# Patient Record
Sex: Female | Born: 1945 | Race: White | Hispanic: No | Marital: Married | Smoking: Never smoker
Health system: Southern US, Community
[De-identification: ages and names within clinical notes are randomized; demographics above are authoritative.]

## PROBLEM LIST (undated history)

## (undated) DIAGNOSIS — K769 Liver disease, unspecified: Secondary | ICD-10-CM

## (undated) DIAGNOSIS — Z86010 Personal history of colonic polyps: Secondary | ICD-10-CM

## (undated) DIAGNOSIS — C801 Malignant (primary) neoplasm, unspecified: Secondary | ICD-10-CM

## (undated) DIAGNOSIS — C187 Malignant neoplasm of sigmoid colon: Secondary | ICD-10-CM

## (undated) DIAGNOSIS — I2699 Other pulmonary embolism without acute cor pulmonale: Secondary | ICD-10-CM

## (undated) DIAGNOSIS — I480 Paroxysmal atrial fibrillation: Secondary | ICD-10-CM

## (undated) DIAGNOSIS — K7689 Other specified diseases of liver: Secondary | ICD-10-CM

## (undated) DIAGNOSIS — C786 Secondary malignant neoplasm of retroperitoneum and peritoneum: Secondary | ICD-10-CM

## (undated) HISTORY — PX: COLONOSCOPY: SHX174

## (undated) HISTORY — DX: Other pulmonary embolism without acute cor pulmonale: I26.99

## (undated) HISTORY — PX: TONSILLECTOMY: SUR1361

## (undated) HISTORY — DX: Paroxysmal atrial fibrillation: I48.0

---

## 1998-05-01 ENCOUNTER — Other Ambulatory Visit: Admission: RE | Admit: 1998-05-01 | Discharge: 1998-05-01 | Payer: Self-pay | Admitting: Obstetrics & Gynecology

## 2001-09-25 ENCOUNTER — Other Ambulatory Visit: Admission: RE | Admit: 2001-09-25 | Discharge: 2001-09-25 | Payer: Self-pay | Admitting: Obstetrics & Gynecology

## 2005-10-25 ENCOUNTER — Other Ambulatory Visit: Admission: RE | Admit: 2005-10-25 | Discharge: 2005-10-25 | Payer: Self-pay | Admitting: Obstetrics & Gynecology

## 2008-07-27 ENCOUNTER — Emergency Department (HOSPITAL_BASED_OUTPATIENT_CLINIC_OR_DEPARTMENT_OTHER): Admission: EM | Admit: 2008-07-27 | Discharge: 2008-07-27 | Payer: Self-pay | Admitting: Emergency Medicine

## 2008-07-29 ENCOUNTER — Ambulatory Visit: Payer: Self-pay | Admitting: Vascular Surgery

## 2008-07-29 ENCOUNTER — Encounter (INDEPENDENT_AMBULATORY_CARE_PROVIDER_SITE_OTHER): Payer: Self-pay | Admitting: Emergency Medicine

## 2008-07-29 ENCOUNTER — Ambulatory Visit (HOSPITAL_COMMUNITY): Admission: RE | Admit: 2008-07-29 | Discharge: 2008-07-29 | Payer: Self-pay | Admitting: Emergency Medicine

## 2011-11-24 ENCOUNTER — Ambulatory Visit: Payer: Self-pay | Admitting: Internal Medicine

## 2011-11-24 DIAGNOSIS — Z719 Counseling, unspecified: Secondary | ICD-10-CM

## 2011-11-24 DIAGNOSIS — Z62820 Parent-biological child conflict: Secondary | ICD-10-CM

## 2011-11-24 NOTE — Progress Notes (Signed)
Ms Goyal made this appointment to talk about family difficulties that chiefly arise from her younger son and his current problems She would like to talk and complete confidence and does not want to involve her insurance in this visit She is struggling with each use where she feels she is enabling his current behavior which is leading to a dead in lifestyle She is supporting he and his live-in girlfriend with their 2 children by allowing free rent She has tried multiple contingency plans for having him finish school or use the training he has thus far to arrange for a job He basically sleeps all day and stays up all my doing something/possibly online gaming He is way overweight and unhealthy in his diet/despite being a former Pharmacist, 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 exercises He is estranged from his father His live-in girlfriend is from [place] state from an impoverished background with no life ambitions other than her current job at [employer] is described as a great mother however The son has a diagnosis of PTSD from his [employer]	[place] experience/he was in training for intelligence and had an injury which resulted in recurrent shoulder dislocations and so he is considered 60% disabled by the [place] She is unaware that he has ever been diagnosed as ADD although he is on medication for this She is certain that he is no longer in school Her description of his past behavior may imply some sort of autism, As he struggles with all person-to-person interactions even with his mother, although she would be the first to describe him as sweet and loving He is unable to interact in a conversation and has no friends  Plan-we discussed further contingency ideas which she will enforce She is given names of potential therapists She can followup as needed

## 2012-05-25 ENCOUNTER — Institutional Professional Consult (permissible substitution): Payer: Self-pay | Admitting: Internal Medicine

## 2012-06-28 ENCOUNTER — Ambulatory Visit (INDEPENDENT_AMBULATORY_CARE_PROVIDER_SITE_OTHER): Payer: BC Managed Care – PPO | Admitting: Internal Medicine

## 2012-06-28 ENCOUNTER — Encounter: Payer: Self-pay | Admitting: Internal Medicine

## 2012-06-28 VITALS — BP 138/76 | HR 77 | Resp 18 | Ht 66.0 in | Wt 174.1 lb

## 2012-06-28 DIAGNOSIS — I4891 Unspecified atrial fibrillation: Secondary | ICD-10-CM

## 2012-06-28 NOTE — Assessment & Plan Note (Signed)
The patient has paroxysmal atrial fibrillation documented by event recorder a year ago with very rapid rates, in excess of 160-180 ;  she also had documented atrial flutter. The differences in these rhythms may explain the regular versus the irregular tachycardia  that she feels.  Her thromboembolic risk profile was not require anticoagulation.  Her symptoms however are very disruptive. We discussed the use of when necessary versus daily antiarrhythmic therapy and also the changing guidelines recently which is allowed catheter ablation to  be pursued prior to a trial of antiarrhythmic therapy. In anticipation of the latter, we discussed centimeters where a catheter ablation could be pursued as well as Dr. Johney Frame experience here. We also elected to pursue an event recorder to try to clarify the mechanism of the intermittent exercise intolerance as well as the regular tachy  palpitations which may represent flutter or possibly perhaps lower atrial fibrillation.

## 2012-06-28 NOTE — Patient Instructions (Signed)
Your physician wants you to follow-up in: 6 weeks with Dr. Graciela Husbands. You will receive a reminder letter in the mail two months in advance. If you don't receive a letter, please call our office to schedule the follow-up appointment.   Your physician has recommended that you wear a 30 day event monitor. Event monitors are medical devices that record the heart's electrical activity. Doctors most often [place] these monitors to diagnose arrhythmias. Arrhythmias are problems with the speed or rhythm of the heartbeat. The monitor is a small, portable device. You can wear one while you do your normal daily activities. This is usually used to diagnose what is causing palpitations/syncope (passing out).

## 2012-06-28 NOTE — Progress Notes (Signed)
ELECTROPHYSIOLOGY CONSULT NOTE  Patient ID: Tonya Stanley, MRN: [id number], DOB/AGE: 1945/10/10 66 y.o. Admit date: (Not on file) Date of Consult: 06/28/2012  Primary Physician: Donato Schultz, MD Primary Cardiologist:MS Chief Complaint: Afib   HPI Tonya Stanley is a 66 y.o. female   Seen in for consideration of therapies for atrial fibrillation. He had seen Dr. Anne Fu. He then saw Dr. Jennette Kettle her gynecologist who referred her this way. Dr. Anne Fu his last note had anticipate her going to do for consideration of catheter ablation therapy.  Her atrial fibrillation goes back a number of years. She estimates 8-10. These episodes have become quite frequent and very disruptive. She reduced her dose of vitamin D. and has noted a significant diminution in the frequency and severity of these episodes .  They're associated with palpitations dizziness and shortness of breath.  She also notes episodic exercise tolerance. This can occur while walking or when up the stairs. On occasion she has taken her pulse and noted that it was rapid but regular. (See below).  Thromboembolic risk factors are negative probably with a CHADS-VASc score of 0 (1)   Cardiac evaluation has included an ultrasound 2012 that was normal I  There is a great deal of psychosocial stress in the home related to her youngest son   Past Medical History  Diagnosis Date  . Paroxysmal atrial fibrillation       Surgical History: No past surgical history on file.   Home Meds: Prior to Admission medications   Not on File     Allergies:  Allergies  Allergen Reactions  . Erythromycin Nausea And Vomiting  . Macrobid (Nitrofurantoin Macrocrystal) Nausea And Vomiting    History   Social History  . Marital Status: Married    Spouse Name: N/A    Number of Children: N/A  . Years of Education: N/A   Occupational History  . Not on file.   Social History Main Topics  . Smoking status: Never Smoker   . Smokeless tobacco: Not on  file  . Alcohol Use: No  . Drug Use: Not on file  . Sexually Active: Not on file   Other Topics Concern  . Not on file   Social History Narrative  . No narrative on file     No family history on file.   ROS:  Please see the history of present illness.   Negative except anxiety   All other systems reviewed and negative.    Physical Exam:  Blood pressure 138/76, pulse 77, resp. rate 18, height 5\' 6"  (1.676 m), weight 174 lb 1.9 oz (78.98 kg), SpO2 98.00%. General: Well developed, well nourished female in no acute distress. Head: Normocephalic, atraumatic, sclera non-icteric, no xanthomas, nares are without discharge. Lymph Nodes:  none Back: without scoliosis/kyphosis, no CVA tendersness Neck: Negative for carotid bruits. JVD not elevated. Lungs: Clear bilaterally to auscultation without wheezes, rales, or rhonchi. Breathing is unlabored. Heart: RRR with S1 S2. No  murmur , rubs, or gallops appreciated. Abdomen: Soft, non-tender, non-distended with normoactive bowel sounds. No hepatomegaly. No rebound/guarding. No obvious abdominal masses. Msk:  Strength and tone appear normal for age. Extremities: No clubbing or cyanosis. No  edema.  Distal pedal pulses are 2+ and equal bilaterally. Skin: Warm and Dry Neuro: Alert and oriented X 3. CN III-XII intact Grossly normal sensory and motor function . Psych:  Responds to questions appropriately with a normal affect.        Radiology/Studies:  No results found.  EKG:  sinus rhythm at 73 Intervals 07/02/39 Axis LXXIII Nonspecific ST segment scooping  Assessment and Plan:  Sherryl Manges

## 2012-06-29 ENCOUNTER — Telehealth: Payer: Self-pay | Admitting: Internal Medicine

## 2012-06-29 NOTE — Telephone Encounter (Signed)
Patient has decided to have event monitor mailed to her home the week of 09/18/12. patient also need 6 week follow-up with klein . Reminder letter will be sent to patient home the first week in february.

## 2012-09-11 ENCOUNTER — Telehealth: Payer: Self-pay | Admitting: *Deleted

## 2012-09-11 NOTE — Telephone Encounter (Signed)
S/W Pt, enrolled monitor to be mailed to her home 09/11/12  TK

## 2012-09-13 DIAGNOSIS — I4891 Unspecified atrial fibrillation: Secondary | ICD-10-CM

## 2012-10-03 ENCOUNTER — Telehealth: Payer: Self-pay | Admitting: Internal Medicine

## 2012-10-03 NOTE — Telephone Encounter (Signed)
10-02-12 at 927 pm dr Graciela Husbands sent staff message to move pt's appt up if possible due to ecardio showing episodes of a-fib with rate in the 160's, was having palpitations, offered pt 3-14, she declined wanting to wait until after 3-21 when stops wearing monitor, made appt 10-18-12 at 900a/mt

## 2012-10-05 ENCOUNTER — Telehealth: Payer: Self-pay | Admitting: Physician Assistant

## 2012-10-05 NOTE — Telephone Encounter (Signed)
Received call from E Cardio that patient has developed atrial flutter with rates as high as 240bpm, currently sustaining 150's. I called patient. She feels some fluttering sensation but it's the same that she's felt intermittently. No CP, SOB, presyncope, syncope or any other sensations. She is not on any medications including any AV nodal agents or blood thinners. Given the rapid nature of the HR I advised she proceed to ER for monitoring because I am worried she would decompensate with this. HR continues in the 150's per [facility]. She refuses to go tonight. I informed her of the potential dangers of this but she prefers instead to call the office tomorrow to see about moving up her appointment. She said, "I'm not sure what they're going to do about this anyway." I told her there are medications that can help with this but she said she does not want to take any. We briefly discussed what CHF is as a potential sequelae. She plans to monitor her symptoms tonight and call the office tomorrow. She verbalized understanding when told to proceed to ER if she begins to feel worse. Dayna Dunn PA-C

## 2012-10-06 ENCOUNTER — Encounter: Payer: Self-pay | Admitting: Internal Medicine

## 2012-10-06 ENCOUNTER — Telehealth: Payer: Self-pay | Admitting: Internal Medicine

## 2012-10-06 ENCOUNTER — Ambulatory Visit (INDEPENDENT_AMBULATORY_CARE_PROVIDER_SITE_OTHER): Payer: Medicare Other | Admitting: Internal Medicine

## 2012-10-06 VITALS — BP 143/80 | HR 66 | Ht 66.5 in | Wt 174.0 lb

## 2012-10-06 DIAGNOSIS — I4891 Unspecified atrial fibrillation: Secondary | ICD-10-CM | POA: Diagnosis not present

## 2012-10-06 DIAGNOSIS — R002 Palpitations: Secondary | ICD-10-CM | POA: Diagnosis not present

## 2012-10-06 DIAGNOSIS — R9431 Abnormal electrocardiogram [ECG] [EKG]: Secondary | ICD-10-CM | POA: Diagnosis not present

## 2012-10-06 MED ORDER — PROPRANOLOL HCL ER 80 MG PO CP24: 80.0000 mg | ORAL_CAPSULE | Freq: Every day | ORAL | Status: DC

## 2012-10-06 MED ORDER — METOPROLOL SUCCINATE ER 50 MG PO TB24: 50.0000 mg | ORAL_TABLET | Freq: Every day | ORAL | Status: DC

## 2012-10-06 MED ORDER — ATENOLOL 50 MG PO TABS: 50.0000 mg | ORAL_TABLET | Freq: Every day | ORAL | Status: DC

## 2012-10-06 NOTE — Assessment & Plan Note (Signed)
Panel showed a QRS duration is also narrowed. There is isoelectric component in lead V4 and in fact in every lead making a diagnosis of WPW unlikely

## 2012-10-06 NOTE — Telephone Encounter (Signed)
Pt given an appt to see Dr. Graciela Husbands today at 4:00, pt agrees and states she will be here.

## 2012-10-06 NOTE — Patient Instructions (Addendum)
Your physician recommends that you schedule a follow-up appointment in: 4-6 WEEKS WITH DR Graciela Husbands

## 2012-10-06 NOTE — Assessment & Plan Note (Signed)
The patient has recurrent episodes of atrial fibrillation and atrial flutter with ventricular rates up to 260 beats per minute. We spent more than 45 minutes discussing treatment options including rate control, rhythm control and catheter ablation. I emphasized the concern that I have that heart rates over 250, potentially allowing [place] to Wolff-Parkinson-White syndrome, are potentially life-threatening and hence no treatment would not be acceptable recommendation.  After this discussion, she has agreed to take beta blockers. I've given her a prescription for atenolol 50, metoprolol succinate 50, and Inderal LA 80. She will try them and random order. We reviewed side effects. We will plan to regroup in the next 4-6 weeks.  Review of the strips have some suggestion of ventricular tachycardia although the lack of symptoms with it may that unlikely.  We also discussed ablation centers. We discussed local expertise, [facility], [facility], [facility], we also discussed the [facility] in [place] as well as the [place] [facility]. They will let us know how is that they would like to proceed.

## 2012-10-06 NOTE — Progress Notes (Signed)
Patient has no care team.   HPI  Tonya Stanley is a 67 y.o. female Seen in followup for monitor obtained because of atrial fibrillation, regular tachycardia palpitations exercise intolerance.  Past Medical History  Diagnosis Date  . Paroxysmal atrial fibrillation     No past surgical history on file.  No current outpatient prescriptions on file.   No current facility-administered medications for this visit.    Allergies  Allergen Reactions  . Erythromycin Nausea And Vomiting  . Macrobid (Nitrofurantoin Macrocrystal) Nausea And Vomiting    Review of Systems negative except from HPI and PMH  Physical Exam BP 143/80  Pulse 66  Ht 5' 6.5" (1.689 m)  Wt 174 lb (78.926 kg)  BMI 27.67 kg/m2 Well developed and well nourished in no acute distress RRR No edema Clear    ECG today demonstrates sinus rhythm at 67 Intervals 06/30/42  Assessment and  Plan

## 2012-10-06 NOTE — Telephone Encounter (Signed)
Pt will see dr Graciela Husbands today at [facility]

## 2012-10-06 NOTE — Telephone Encounter (Signed)
New Problem:    Patient called in to follow-up on her call form last night.  See note from [facility] on 10/05/12.

## 2012-10-10 ENCOUNTER — Telehealth: Payer: Self-pay | Admitting: Internal Medicine

## 2012-10-10 NOTE — Telephone Encounter (Signed)
ROI signed By Pt, All Cardiac Faxed to [facility]  @ [phone]    10/10/12/KM

## 2012-10-18 ENCOUNTER — Telehealth: Payer: Self-pay | Admitting: Internal Medicine

## 2012-10-18 ENCOUNTER — Ambulatory Visit: Payer: BC Managed Care – PPO | Admitting: Internal Medicine

## 2012-10-18 ENCOUNTER — Other Ambulatory Visit: Payer: Self-pay | Admitting: *Deleted

## 2012-10-18 DIAGNOSIS — I4891 Unspecified atrial fibrillation: Secondary | ICD-10-CM

## 2012-10-18 NOTE — Telephone Encounter (Signed)
Spoke With Pt made Her Aware Dr.Klein Will not Be back in office until Friday ( 10/20/12)  I also let pt Know I Will see if Dr.Klein can Read/Sign her Monitor so this can Get Sent Down  To [facility] where she Is going to be Seeing Dr.Marcus Delena Serve, ROI On File To be Able to  NIKE.Pt Is to Call Back Between 11-12 on Friday to See if Results Have Been  read and Sent to [facility]. Her appt is Friday In [facility]  10/18/12/KM

## 2012-10-20 ENCOUNTER — Telehealth: Payer: Self-pay | Admitting: Internal Medicine

## 2012-10-20 NOTE — Telephone Encounter (Signed)
Spoke With Pt, She is Aware Monitor will Not Be signed off on Until  Tuesday, She asked to Speak With Print production planner, I took Mrs.Flaim  # To Debby Freiberg for her to Call Pt 10/20/12/Km

## 2012-10-24 ENCOUNTER — Telehealth: Payer: Self-pay | Admitting: Internal Medicine

## 2012-10-24 NOTE — Telephone Encounter (Signed)
New problem   Pt stated Tonya Stanley called her and advise her to call back to speak to Tonya Stanley per pt. Please call pt concerning this.

## 2012-10-24 NOTE — Telephone Encounter (Signed)
Faxed Monitor To Dr.Marc[facility] @ [phone], pt also came Into Office Signed ROI and picked Up copy of Monitor. 10/24/12/KM

## 2012-10-24 NOTE — Telephone Encounter (Signed)
Spoke with pt, she wanted to find out if she has the procedure with dr Delena Serve will dr Graciela Husbands do her follow up. Pt told that yes dr Graciela Husbands will be willing to follow up after procedure.

## 2012-10-25 DIAGNOSIS — I456 Pre-excitation syndrome: Secondary | ICD-10-CM | POA: Diagnosis not present

## 2012-10-25 DIAGNOSIS — I498 Other specified cardiac arrhythmias: Secondary | ICD-10-CM | POA: Diagnosis not present

## 2012-10-25 DIAGNOSIS — I4892 Unspecified atrial flutter: Secondary | ICD-10-CM | POA: Diagnosis not present

## 2012-10-25 DIAGNOSIS — I4891 Unspecified atrial fibrillation: Secondary | ICD-10-CM | POA: Diagnosis not present

## 2012-10-25 DIAGNOSIS — I499 Cardiac arrhythmia, unspecified: Secondary | ICD-10-CM | POA: Diagnosis not present

## 2012-10-26 DIAGNOSIS — I4891 Unspecified atrial fibrillation: Secondary | ICD-10-CM | POA: Diagnosis not present

## 2012-10-28 ENCOUNTER — Emergency Department (HOSPITAL_COMMUNITY)
Admission: EM | Admit: 2012-10-28 | Discharge: 2012-10-28 | Disposition: A | Discharge status: Home / Self Care | Payer: Medicare Other | Attending: Emergency Medicine | Admitting: Emergency Medicine

## 2012-10-28 ENCOUNTER — Encounter (HOSPITAL_COMMUNITY): Payer: Self-pay | Admitting: Emergency Medicine

## 2012-10-28 ENCOUNTER — Other Ambulatory Visit: Payer: Self-pay

## 2012-10-28 ENCOUNTER — Emergency Department (HOSPITAL_COMMUNITY): Payer: Medicare Other

## 2012-10-28 DIAGNOSIS — J45901 Unspecified asthma with (acute) exacerbation: Secondary | ICD-10-CM | POA: Insufficient documentation

## 2012-10-28 DIAGNOSIS — Z79899 Other long term (current) drug therapy: Secondary | ICD-10-CM | POA: Diagnosis not present

## 2012-10-28 DIAGNOSIS — R062 Wheezing: Secondary | ICD-10-CM | POA: Diagnosis not present

## 2012-10-28 DIAGNOSIS — J45909 Unspecified asthma, uncomplicated: Secondary | ICD-10-CM | POA: Diagnosis not present

## 2012-10-28 DIAGNOSIS — I4891 Unspecified atrial fibrillation: Secondary | ICD-10-CM | POA: Insufficient documentation

## 2012-10-28 DIAGNOSIS — R0602 Shortness of breath: Secondary | ICD-10-CM | POA: Diagnosis not present

## 2012-10-28 LAB — BASIC METABOLIC PANEL
BUN: 11 mg/dL (ref 6–23)
Calcium: 9.3 mg/dL (ref 8.4–10.5)
Chloride: 100 mEq/L (ref 96–112)
Creatinine, Ser: 0.7 mg/dL (ref 0.50–1.10)
GFR calc Af Amer: >90 mL/min (ref >90)

## 2012-10-28 LAB — CBC
HCT: 41.3 % (ref 36.0–46.0)
MCHC: 33.7 g/dL (ref 30.0–36.0)
Platelets: 249 10*3/uL (ref 150–400)
RDW: 13.4 % (ref 11.5–15.5)
WBC: 9.2 10*3/uL (ref 4.0–10.5)

## 2012-10-28 LAB — POCT I-STAT TROPONIN I: Troponin i, poc: 0 ng/mL (ref 0.00–0.08)

## 2012-10-28 MED ORDER — ALBUTEROL SULFATE (5 MG/ML) 0.5% IN NEBU: 5.0000 mg | INHALATION_SOLUTION | Freq: Once | RESPIRATORY_TRACT | Status: DC

## 2012-10-28 MED ORDER — ASPIRIN 81 MG PO CHEW
324.0000 mg | CHEWABLE_TABLET | Freq: Once | ORAL | Status: AC
  Administered 2012-10-28: 324 mg via ORAL
  Filled 2012-10-28: qty 4

## 2012-10-28 MED ORDER — METHYLPREDNISOLONE SODIUM SUCC 125 MG IJ SOLR
125.0000 mg | Freq: Once | INTRAMUSCULAR | Status: AC
  Administered 2012-10-28: 125 mg via INTRAVENOUS
  Filled 2012-10-28: qty 2

## 2012-10-28 MED ORDER — ALBUTEROL SULFATE (5 MG/ML) 0.5% IN NEBU
5.0000 mg | INHALATION_SOLUTION | Freq: Once | RESPIRATORY_TRACT | Status: AC
  Administered 2012-10-28: 5 mg via RESPIRATORY_TRACT
  Filled 2012-10-28: qty 1

## 2012-10-28 MED ORDER — PREDNISONE 20 MG PO TABS: 60.0000 mg | ORAL_TABLET | Freq: Every day | ORAL | Status: DC

## 2012-10-28 NOTE — ED Notes (Signed)
Pt c/o SHOB for last 6 hrs.pt unable to lay down.

## 2012-10-28 NOTE — ED Provider Notes (Signed)
History     CSN: [id number]  Arrival date & time 10/28/12  0047   First MD Initiated Contact with Patient 10/28/12 0105      Chief Complaint  Patient presents with  . Shortness of Breath    (Consider location/radiation/quality/duration/timing/severity/associated sxs/prior treatment) HPI History provided by patient. Shortness of breath the last few days worse over the last hour with associated wheezing. Has history of exercise-induced asthma and occasionally requires albuterol. She takes unknown blood thinner for history of atrial fibrillation and is scheduled for ablation in [place] 11/13/2012.  She is followed locally by [facility] cardiology. Symptoms moderate in severity, worse with exertion or trying to lay down. No associated leg swelling or leg pain. No chest pain. History of same with her asthma. Past Medical History  Diagnosis Date  . Paroxysmal atrial fibrillation     No past surgical history on file.  No family history on file.  History  Substance Use Topics  . Smoking status: Never Smoker   . Smokeless tobacco: Not on file  . Alcohol Use: No    OB History   Grav Para Term Preterm Abortions TAB SAB Ect Mult Living                  Review of Systems  Constitutional: Negative for fever and chills.  HENT: Negative for neck pain and neck stiffness.   Eyes: Negative for pain.  Respiratory: Positive for shortness of breath and wheezing.   Cardiovascular: Negative for chest pain.  Gastrointestinal: Negative for abdominal pain.  Genitourinary: Negative for dysuria.  Musculoskeletal: Negative for back pain.  Skin: Negative for rash.  Neurological: Negative for headaches.  All other systems reviewed and are negative.    Allergies  Erythromycin and Macrobid  Home Medications   Current Outpatient Rx  Name  Route  Sig  Dispense  Refill  . atenolol (TENORMIN) 50 MG tablet   Oral   Take 1 tablet (50 mg total) by mouth daily.   30 tablet   12   .  metoprolol succinate (TOPROL-XL) 50 MG 24 hr tablet   Oral   Take 1 tablet (50 mg total) by mouth daily. Take with or immediately following a meal.   30 tablet   12   . propranolol ER (INDERAL LA) 80 MG 24 hr capsule   Oral   Take 1 capsule (80 mg total) by mouth daily.   30 capsule   12     BP 149/99  Pulse 71  Temp(Src) 97.7 F (36.5 C) (Oral)  Resp 14  SpO2 98%  Physical Exam  Constitutional: She is oriented to person, place, and time. She appears well-developed and well-nourished.  HENT:  Head: Normocephalic and atraumatic.  Mouth/Throat: Oropharynx is clear and moist.  Eyes: EOM are normal. Pupils are equal, round, and reactive to light.  Neck: Neck supple.  Cardiovascular: Normal rate, regular rhythm and intact distal pulses.   Pulmonary/Chest: Effort normal.  Tachypnea with decreased bilateral breath sounds and expiratory wheezes  Musculoskeletal: Normal range of motion. She exhibits no edema.  Neurological: She is alert and oriented to person, place, and time.  Skin: Skin is warm and dry.  Psychiatric:  Anxious    ED Course  Procedures (including critical care time)  Results for orders placed during the hospital encounter of 10/28/12  BASIC METABOLIC PANEL      Result Value Range   Sodium 136  135 - 145 mEq/L   Potassium 4.1  3.5 -  5.1 mEq/L   Chloride 100  96 - 112 mEq/L   CO2 27  19 - 32 mEq/L   Glucose, Bld 109 (*) 70 - 99 mg/dL   BUN 11  6 - 23 mg/dL   Creatinine, Ser 7.82  0.50 - 1.10 mg/dL   Calcium 9.3  8.4 - 95.6 mg/dL   GFR calc non Af Amer 89 (*) >90 mL/min   GFR calc Af Amer >90  >90 mL/min  CBC      Result Value Range   WBC 9.2  4.0 - 10.5 K/uL   RBC 4.85  3.87 - 5.11 MIL/uL   Hemoglobin 13.9  12.0 - 15.0 g/dL   HCT 21.3  08.6 - 57.8 %   MCV 85.2  78.0 - 100.0 fL   MCH 28.7  26.0 - 34.0 pg   MCHC 33.7  30.0 - 36.0 g/dL   RDW 46.9  62.9 - 52.8 %   Platelets 249  150 - 400 K/uL  PRO B NATRIURETIC PEPTIDE      Result Value Range    Pro B Natriuretic peptide (BNP) 57.7  0 - 125 pg/mL  POCT I-STAT TROPONIN I      Result Value Range   Troponin i, poc 0.00  0.00 - 0.08 ng/mL   Comment 3            Dg Chest 2 View (if Patient Has Fever And/or Copd)  10/28/2012  *RADIOLOGY REPORT*  Clinical Data: Shortness of breath.  CHEST - 2 VIEW  Comparison: None.  Findings: Heart size is normal.  The lungs are free of focal consolidations and pleural effusions.  No pulmonary edema.  There is mild anterior wedging of the T12 vertebral body, of indeterminate age.  Mild degenerative changes are identified in the thoracic spine.  IMPRESSION: No evidence for acute cardiopulmonary abnormality.   Original Report Authenticated By: Norva Pavlov, M.D.      Date: 10/28/2012  Rate: 63  Rhythm: normal sinus rhythm  QRS Axis: normal  Intervals: normal  ST/T Wave abnormalities: nonspecific ST changes  Conduction Disutrbances:none  Narrative Interpretation:   Old EKG Reviewed: none available  Albuterol and steroids provided. Recheck after single treatment lungs clear patient feels significantly better. On further history she does not carry her albuterol inhaler with her and has not required it in some time - she was recently told by her cardiologist not to take albuterol, she is very reluctant to do so at this time.  She declines a second albuterol treatment.    After period of observation lungs remain clear and she is asymptomatic. She agrees to carry her inhaler with her in case of another asthma attack.  Plan discharge home with prednisone, close primary care followup. She will discuss with her cardiologist her medications and prn albuterol.  No indication for admission or further workup at this time  MDM  Asthma attack - improved with albuterol steroids  EKG. Chest x-ray. Labs.  Vital signs nursing notes reviewed and considered        Sunnie Nielsen, MD 10/28/12 [phone]

## 2012-11-02 ENCOUNTER — Telehealth: Payer: Self-pay | Admitting: Internal Medicine

## 2012-11-02 ENCOUNTER — Other Ambulatory Visit: Payer: Self-pay | Admitting: Family Medicine

## 2012-11-02 DIAGNOSIS — E559 Vitamin D deficiency, unspecified: Secondary | ICD-10-CM | POA: Diagnosis not present

## 2012-11-02 DIAGNOSIS — E0590 Thyrotoxicosis, unspecified without thyrotoxic crisis or storm: Secondary | ICD-10-CM

## 2012-11-02 DIAGNOSIS — I4891 Unspecified atrial fibrillation: Secondary | ICD-10-CM | POA: Diagnosis not present

## 2012-11-02 DIAGNOSIS — E049 Nontoxic goiter, unspecified: Secondary | ICD-10-CM

## 2012-11-02 DIAGNOSIS — J45990 Exercise induced bronchospasm: Secondary | ICD-10-CM | POA: Diagnosis not present

## 2012-11-02 NOTE — Telephone Encounter (Signed)
New problem   Pt having severe breathing problems.

## 2012-11-02 NOTE — Telephone Encounter (Signed)
I left a message on the patient's voice mail that it is probably ok to start cardizem, but the patient's ablation is scheduled to be done with Dr. Delena Serve at [facility]. I have stated that she should call Dr. Alfonso Patten office to verify this is ok.

## 2012-11-02 NOTE — Telephone Encounter (Signed)
Pt is scheduled for  an ablation on the [address] this month. She tried Propanolol medication but that medication was given her breathing problems, so she started taken Metoprolol. Pt  is having breathing problems again with Metoprolol. Pt went to see the PCP today and he wants for pt  to change her medication  to Cardizem 120 mg daily. Pt would like to know if she can change the medication without problems with the ablation.

## 2012-11-09 ENCOUNTER — Other Ambulatory Visit (HOSPITAL_COMMUNITY): Payer: Self-pay | Admitting: Family Medicine

## 2012-11-09 ENCOUNTER — Telehealth: Payer: Self-pay | Admitting: Internal Medicine

## 2012-11-09 DIAGNOSIS — E0590 Thyrotoxicosis, unspecified without thyrotoxic crisis or storm: Secondary | ICD-10-CM

## 2012-11-09 DIAGNOSIS — E049 Nontoxic goiter, unspecified: Secondary | ICD-10-CM

## 2012-11-09 NOTE — Telephone Encounter (Signed)
New Problem:    Patient called in because she is going out of town for an ablation on Monday and she would like to know what would be the best contact number for Dr. Sinclair Grooms she had an emergency.  Please call back.

## 2012-11-09 NOTE — Telephone Encounter (Signed)
Spoke with pt, she was given the main number to the office to call with a problem.

## 2012-11-10 ENCOUNTER — Ambulatory Visit: Payer: BC Managed Care – PPO | Admitting: Internal Medicine

## 2012-11-13 DIAGNOSIS — I4892 Unspecified atrial flutter: Secondary | ICD-10-CM | POA: Diagnosis not present

## 2012-11-13 DIAGNOSIS — J45990 Exercise induced bronchospasm: Secondary | ICD-10-CM | POA: Diagnosis not present

## 2012-11-13 DIAGNOSIS — I491 Atrial premature depolarization: Secondary | ICD-10-CM | POA: Diagnosis not present

## 2012-11-13 DIAGNOSIS — I4891 Unspecified atrial fibrillation: Secondary | ICD-10-CM | POA: Diagnosis not present

## 2012-11-13 DIAGNOSIS — Z79899 Other long term (current) drug therapy: Secondary | ICD-10-CM | POA: Diagnosis not present

## 2012-11-13 DIAGNOSIS — K7689 Other specified diseases of liver: Secondary | ICD-10-CM | POA: Diagnosis not present

## 2012-11-13 HISTORY — PX: ABLATION OF DYSRHYTHMIC FOCUS: SHX254

## 2012-11-14 DIAGNOSIS — Z79899 Other long term (current) drug therapy: Secondary | ICD-10-CM | POA: Diagnosis not present

## 2012-11-14 DIAGNOSIS — J45990 Exercise induced bronchospasm: Secondary | ICD-10-CM | POA: Diagnosis not present

## 2012-11-14 DIAGNOSIS — K7689 Other specified diseases of liver: Secondary | ICD-10-CM | POA: Diagnosis not present

## 2012-11-14 DIAGNOSIS — I4892 Unspecified atrial flutter: Secondary | ICD-10-CM | POA: Diagnosis not present

## 2012-11-14 DIAGNOSIS — I4891 Unspecified atrial fibrillation: Secondary | ICD-10-CM | POA: Diagnosis not present

## 2012-11-16 ENCOUNTER — Ambulatory Visit: Payer: BC Managed Care – PPO | Admitting: Internal Medicine

## 2012-11-17 ENCOUNTER — Ambulatory Visit (INDEPENDENT_AMBULATORY_CARE_PROVIDER_SITE_OTHER): Payer: Medicare Other | Admitting: Emergency Medicine

## 2012-11-17 ENCOUNTER — Emergency Department (HOSPITAL_COMMUNITY): Payer: Medicare Other

## 2012-11-17 ENCOUNTER — Inpatient Hospital Stay (HOSPITAL_COMMUNITY)
Admission: EM | Admit: 2012-11-17 | Discharge: 2012-11-18 | DRG: 175 | Disposition: A | Discharge status: Home / Self Care | Payer: Medicare Other | Attending: Internal Medicine | Admitting: Internal Medicine

## 2012-11-17 ENCOUNTER — Encounter (HOSPITAL_COMMUNITY): Payer: Self-pay | Admitting: Emergency Medicine

## 2012-11-17 ENCOUNTER — Ambulatory Visit: Payer: Medicare Other

## 2012-11-17 VITALS — BP 122/73 | HR 87 | Temp 97.9°F | Resp 30 | Ht 66.5 in | Wt 180.0 lb

## 2012-11-17 DIAGNOSIS — J90 Pleural effusion, not elsewhere classified: Secondary | ICD-10-CM | POA: Diagnosis not present

## 2012-11-17 DIAGNOSIS — E8770 Fluid overload, unspecified: Secondary | ICD-10-CM | POA: Diagnosis present

## 2012-11-17 DIAGNOSIS — K7689 Other specified diseases of liver: Secondary | ICD-10-CM | POA: Diagnosis present

## 2012-11-17 DIAGNOSIS — I5031 Acute diastolic (congestive) heart failure: Secondary | ICD-10-CM | POA: Diagnosis present

## 2012-11-17 DIAGNOSIS — Z7901 Long term (current) use of anticoagulants: Secondary | ICD-10-CM | POA: Diagnosis not present

## 2012-11-17 DIAGNOSIS — R0600 Dyspnea, unspecified: Secondary | ICD-10-CM | POA: Diagnosis present

## 2012-11-17 DIAGNOSIS — R0602 Shortness of breath: Secondary | ICD-10-CM

## 2012-11-17 DIAGNOSIS — R0989 Other specified symptoms and signs involving the circulatory and respiratory systems: Secondary | ICD-10-CM | POA: Diagnosis not present

## 2012-11-17 DIAGNOSIS — R799 Abnormal finding of blood chemistry, unspecified: Secondary | ICD-10-CM

## 2012-11-17 DIAGNOSIS — E8779 Other fluid overload: Secondary | ICD-10-CM | POA: Diagnosis not present

## 2012-11-17 DIAGNOSIS — I4891 Unspecified atrial fibrillation: Secondary | ICD-10-CM | POA: Diagnosis present

## 2012-11-17 DIAGNOSIS — I509 Heart failure, unspecified: Secondary | ICD-10-CM | POA: Diagnosis present

## 2012-11-17 DIAGNOSIS — M25562 Pain in left knee: Secondary | ICD-10-CM

## 2012-11-17 DIAGNOSIS — R7989 Other specified abnormal findings of blood chemistry: Secondary | ICD-10-CM | POA: Diagnosis present

## 2012-11-17 DIAGNOSIS — M25569 Pain in unspecified knee: Secondary | ICD-10-CM

## 2012-11-17 DIAGNOSIS — M79609 Pain in unspecified limb: Secondary | ICD-10-CM | POA: Diagnosis not present

## 2012-11-17 DIAGNOSIS — Z79899 Other long term (current) drug therapy: Secondary | ICD-10-CM | POA: Diagnosis not present

## 2012-11-17 DIAGNOSIS — I2699 Other pulmonary embolism without acute cor pulmonale: Principal | ICD-10-CM | POA: Diagnosis present

## 2012-11-17 DIAGNOSIS — R0609 Other forms of dyspnea: Secondary | ICD-10-CM | POA: Diagnosis not present

## 2012-11-17 DIAGNOSIS — M7989 Other specified soft tissue disorders: Secondary | ICD-10-CM | POA: Diagnosis not present

## 2012-11-17 HISTORY — DX: Liver disease, unspecified: K76.9

## 2012-11-17 LAB — CBC WITH DIFFERENTIAL/PLATELET
Basophils Absolute: 0 10*3/uL (ref 0.0–0.1)
Basophils Relative: 0 % (ref 0–1)
Eosinophils Absolute: 0.1 10*3/uL (ref 0.0–0.7)
Eosinophils Relative: 1 % (ref 0–5)
MCH: 29.4 pg (ref 26.0–34.0)
MCHC: 34.1 g/dL (ref 30.0–36.0)
MCV: 86.3 fL (ref 78.0–100.0)
Neutrophils Relative %: 76 % (ref 43–77)
Platelets: 230 10*3/uL (ref 150–400)
RDW: 13.6 % (ref 11.5–15.5)

## 2012-11-17 LAB — COMPREHENSIVE METABOLIC PANEL
ALT: 70 U/L — ABNORMAL HIGH (ref 0–35)
AST: 31 U/L (ref 0–37)
Albumin: 3.5 g/dL (ref 3.5–5.2)
Chloride: 103 mEq/L (ref 96–112)
Creatinine, Ser: 0.7 mg/dL (ref 0.50–1.10)
Potassium: 3.5 mEq/L (ref 3.5–5.1)
Sodium: 140 mEq/L (ref 135–145)
Total Bilirubin: 0.5 mg/dL (ref 0.3–1.2)

## 2012-11-17 LAB — URINALYSIS, ROUTINE W REFLEX MICROSCOPIC
Bilirubin Urine: NEGATIVE
Hgb urine dipstick: NEGATIVE
Ketones, ur: NEGATIVE mg/dL
Specific Gravity, Urine: 1.012 (ref 1.005–1.030)
pH: 7 (ref 5.0–8.0)

## 2012-11-17 LAB — PRO B NATRIURETIC PEPTIDE: Pro B Natriuretic peptide (BNP): 209.5 pg/mL — ABNORMAL HIGH (ref 0–125)

## 2012-11-17 MED ORDER — ASPIRIN 81 MG PO CHEW
CHEWABLE_TABLET | ORAL | Status: AC
  Filled 2012-11-17: qty 1

## 2012-11-17 MED ORDER — SODIUM CHLORIDE 0.9 % IJ SOLN
3.0000 mL | Freq: Two times a day (BID) | INTRAMUSCULAR | Status: DC
  Administered 2012-11-17 (×2): 3 mL via INTRAVENOUS

## 2012-11-17 MED ORDER — ENOXAPARIN SODIUM 80 MG/0.8ML ~~LOC~~ SOLN
80.0000 mg | Freq: Once | SUBCUTANEOUS | Status: AC
  Administered 2012-11-17: 80 mg via SUBCUTANEOUS
  Filled 2012-11-17: qty 0.8

## 2012-11-17 MED ORDER — VITAMIN D (ERGOCALCIFEROL) 1.25 MG (50000 UNIT) PO CAPS: 50000.0000 [IU] | ORAL_CAPSULE | ORAL | Status: DC

## 2012-11-17 MED ORDER — POTASSIUM CHLORIDE CRYS ER 20 MEQ PO TBCR
20.0000 meq | EXTENDED_RELEASE_TABLET | Freq: Once | ORAL | Status: AC
  Administered 2012-11-17: 20 meq via ORAL
  Filled 2012-11-17: qty 1

## 2012-11-17 MED ORDER — IOHEXOL 350 MG/ML SOLN
100.0000 mL | Freq: Once | INTRAVENOUS | Status: AC | PRN
  Administered 2012-11-17: 100 mL via INTRAVENOUS

## 2012-11-17 MED ORDER — FUROSEMIDE 10 MG/ML IJ SOLN
40.0000 mg | Freq: Once | INTRAMUSCULAR | Status: AC
  Administered 2012-11-17: 40 mg via INTRAVENOUS
  Filled 2012-11-17: qty 4

## 2012-11-17 MED ORDER — ENOXAPARIN SODIUM 80 MG/0.8ML ~~LOC~~ SOLN
80.0000 mg | Freq: Two times a day (BID) | SUBCUTANEOUS | Status: DC
  Administered 2012-11-18: 80 mg via SUBCUTANEOUS
  Filled 2012-11-17 (×3): qty 0.8

## 2012-11-17 MED ORDER — LEVALBUTEROL TARTRATE 45 MCG/ACT IN AERO
2.0000 | INHALATION_SPRAY | Freq: Three times a day (TID) | RESPIRATORY_TRACT | Status: DC | PRN
  Filled 2012-11-17: qty 15

## 2012-11-17 MED ORDER — ASPIRIN EC 81 MG PO TBEC
81.0000 mg | DELAYED_RELEASE_TABLET | Freq: Every day | ORAL | Status: DC
  Administered 2012-11-18: 81 mg via ORAL
  Filled 2012-11-17 (×2): qty 1

## 2012-11-17 MED ORDER — DILTIAZEM HCL 60 MG PO TABS: 120.0000 mg | ORAL_TABLET | Freq: Every morning | ORAL | Status: DC

## 2012-11-17 MED ORDER — DILTIAZEM HCL ER COATED BEADS 120 MG PO CP24
120.0000 mg | ORAL_CAPSULE | Freq: Every day | ORAL | Status: DC
  Administered 2012-11-18: 120 mg via ORAL
  Filled 2012-11-17: qty 1

## 2012-11-17 NOTE — H&P (Signed)
[facility] History and Physical  Tonya Stanley HYQ:[id number] DOB: [birth year] DOA: 11/17/2012  Referring physician: Dr. Clarene Duke PCP: No primary provider on file.  Specialists: Cardiology consulted by EDP  Chief Complaint: shortness of breath and fluid overload  HPI: Tonya Stanley is a 67 y.o. female has a past medical history significant for atrial fibrillation status post ablation 4 days ago, currently on Dronedarone, Apixaban, Diltiazem presents with the above mentioned symptoms. She states that since her procedure was on Monday, she has noted that she has swelling in her legs and her left knee is bothering more endorsing a 10 pound weight gain. She also is having a hard time breathing especially with exertion. Denies any palpitations or chest pain. She denies any lightheadedness. Denies any nausea or vomiting or diarrhea. Denies any blood in her urine or stool.   Review of Systems: as per history of present illness, otherwise negative  Past Medical History  Diagnosis Date  . Paroxysmal atrial fibrillation   . Liver lesion    Past Surgical History  Procedure Laterality Date  . Ablation of dysrhythmic focus  11/13/2012   Social History:  reports that she has never smoked. She does not have any smokeless tobacco history on file. She reports that she does not drink alcohol. Her drug history is not on file.  Allergies  Allergen Reactions  . Erythromycin Nausea And Vomiting  . Macrobid (Nitrofurantoin Macrocrystal) Nausea And Vomiting    Family history noncontributory  Prior to Admission medications   Medication Sig Start Date End Date Taking? Authorizing Provider  apixaban (ELIQUIS) 5 MG TABS tablet Take 5 mg by mouth 2 (two) times daily.   Yes Historical Provider, MD  aspirin EC 81 MG tablet Take 81 mg by mouth every morning.   Yes Historical Provider, MD  diltiazem (CARDIZEM) 120 MG tablet Take 120 mg by mouth every morning.    Yes Historical Provider, MD  dronedarone  (MULTAQ) 400 MG tablet Take 400 mg by mouth 2 (two) times daily with a meal.   Yes Historical Provider, MD  Vitamin D, Ergocalciferol, (DRISDOL) 50000 UNITS CAPS Take 50,000 Units by mouth 2 (two) times a week.   Yes Historical Provider, MD   Physical Exam: Filed Vitals:   11/17/12 1055  BP: 142/79  Pulse: 76  Temp: 98.4 F (36.9 C)  TempSrc: Oral  Resp: 20  SpO2: 95%     General:  very anxious Caucasian female in no apparent distress   Eyes: PERRL, EOMI  ENT: moist oropharynx  Neck: supple, minimal JVD  Cardiovascular: RRR without MRG   Respiratory:  good air movement, bilateral lower field crackles with diminished breath sounds   Abdomen: soft, non tender to palpation, positive bowel sounds, no guarding, no rebound  Skin: no rashes  Musculoskeletal:  1-2+ pitting LE edema  Psychiatric: normal mood and affect, very anxious   Neurologic: CN 2-12 grossly intact, MS 5/5 in all 4  Labs on Admission:  Basic Metabolic Panel:  Recent Labs Lab 11/17/12 1145  NA 140  K 3.5  CL 103  CO2 25  GLUCOSE 96  BUN 10  CREATININE 0.70  CALCIUM 9.2   Liver Function Tests:  Recent Labs Lab 11/17/12 1145  AST 31  ALT 70*  ALKPHOS 66  BILITOT 0.5  PROT 6.7  ALBUMIN 3.5   CBC:  Recent Labs Lab 11/17/12 1145  WBC 9.0  NEUTROABS 6.8  HGB 12.4  HCT 36.4  MCV 86.3  PLT 230  Cardiac Enzymes:  Recent Labs Lab 11/17/12 1145  TROPONINI <0.30    BNP (last 3 results)  Recent Labs  10/28/12 0118 11/17/12 1145  PROBNP 57.7 209.5*   Radiological Exams on Admission: Dg Chest 2 View  11/17/2012  *RADIOLOGY REPORT*  Clinical Data: Shortness of breath.  CHEST - 2 VIEW  Comparison: 10/28/2012  Findings: Hyperinflation of the lungs.  Heart is normal size. Posterior opacity noted on the lateral view.  This likely represents a small right pleural effusion and right base atelectasis.  Left lung is clear.  No acute bony abnormality.  IMPRESSION: Small right pleural  effusion with right base atelectasis.  Mild hyperinflation.   Original Report Authenticated By: Charlett Nose, M.D.    Ct Angio Chest W/cm &/or Wo Cm  11/17/2012  *RADIOLOGY REPORT*  Clinical Data: Short of breath.  Status post cardiac ablation 1 week ago.  Fluid retention.  CT ANGIOGRAPHY CHEST  Technique:  Multidetector CT imaging of the chest using the standard protocol during bolus administration of intravenous contrast. Multiplanar reconstructed images including MIPs were obtained and reviewed to evaluate the vascular anatomy.  Contrast: OMNIPAQUE IOHEXOL 350 MG/ML SOLN  Comparison: None.  Findings: There is a small low density filling defect in a segmental branch of the left upper lobe on image 110 of series 10. No large central pulmonary thromboembolism.  No obvious aortic aneurysm or dissection.  A aberrant right subclavian artery, a normal variation is noted.  Small bilateral pleural effusions right greater than left have developed.  Negative abnormal mediastinal adenopathy.  No pneumothorax.  3 mm right upper lobe nodule on image 20.  3 mm left upper lobe nodule on image 17.  No destructive bone lesion.  Images of the upper abdomen demonstrate an indeterminate partially imaged lesion within the left lobe of the liver.  The portion visualized is 4.0 cm.  IMPRESSION: This study is positive for segmental pulmonary thromboembolism in the left upper lobe.  Bilateral pleural effusion.  3 mm of bilateral upper lobe pulmonary nodules. If the patient is at high risk for bronchogenic carcinoma, follow-up chest CT at 1 year is recommended.  If the patient is at low risk, no follow-up is needed.  This recommendation follows the consensus statement: Guidelines for Management of Small Pulmonary Nodules Detected on CT Scans:  A Statement from the Fleischner Society as published in Radiology 2005; 237:395-400.  Partially imaged indeterminate left lobe liver lesion. This should be further evaluated with either CT or  MR with contrast.   Original Report Authenticated By: Jolaine Click, M.D.     EKG: Independently reviewed.  Assessment/Plan Active Problems:   Atrial fibrillation   Fluid overload   Elevated brain natriuretic peptide (BNP) level  PE - while on Eloquis as an outpatient   - d/c Eloquis, place on Lovenox anticoagulation and she can probably have Xarelto as an outpatient if cardiology agrees  Fluid overload - may be related to recent procedure if she received IVF - lasix 40 iv once - monitor UOP, strict I&Os  - Dronedarone may cause new onset heart failure. We'll obtain a 2-D echo. Hold is for now as patient is now hospitalized with fluid overload and elevated BNP. - cardiology consulted by EDP, appreciate their input regarding antiarrhythmics  - I doubt that the PE is large enough to cause heart failure   A fib  - s/p ablation, now in sinus - continue home medications, appreciate cardiology input  Elevated BNP - iv Lasix as above  Code Status: presumed Full  Family Communication: none  Disposition Plan: inpatient  Time spent: 68  Evonte Prestage M. Elvera Lennox, MD [facility] Pager [phone]  If 7PM-7AM, please contact night-coverage [email or web address] Password [facility] 11/17/2012, 2:58 PM

## 2012-11-17 NOTE — Consult Note (Addendum)
CARDIOLOGY CONSULT NOTE    Patient ID: Tonya Stanley MRN: [id number] DOB/AGE: Aug 08, 1945 67 y.o.  Admit date: 11/17/2012  Primary Cardiologist:  Sherryl Manges MD Reason for Consultation: Dyspnea  HPI: Tonya Stanley is seen at the request of the hospitalist service for evaluation of dyspnea. She is a pleasant 67 year old white female who has a history of paroxysmal atrial fibrillation. With atrial fibrillation she has a rapid ventricular response and becomes very symptomatic. She was treated with rate control. Her [place] score was 0 so she was not initially anticoagulated. Because of her progressive symptoms she was referred for radiofrequency ablation at [facility] by Dr. Delena Serve. The patient was admitted this past Sunday and had her ablation Monday. She was discharged on Tuesday. Prior to her ablation she was treated with Elliquis for 2 weeks and remains on this at 5 mg twice a day. After her procedure she was also started on Multaq. She reports that during her hospital stay she gained 10 pounds. She complains of feeling more bloated and increased swelling in her legs. She has some discomfort posterior to her left knee. Today she complained of increased shortness of breath and was referred to the emergency department. She reports that she has had shortness of breath for the past year. It was clearly exacerbated by beta blockers. She has associated wheezing and does note improvement with inhaler therapy. She was seen in the emergency department here on April 5 with increased shortness of breath and was treated for acute asthma attack. She denies any cough. She has no fever or chills. She denies any chest pain including pleuritic chest pain. She has had no significant orthopnea or PND.  Review of systems complete and found to be negative unless listed above   Past Medical History  Diagnosis Date  . Paroxysmal atrial fibrillation   . Liver lesion     No family history on file.  History   Social History  .  Marital Status: Married    Spouse Name: N/A    Number of Children: N/A  . Years of Education: N/A   Occupational History  . Not on file.   Social History Main Topics  . Smoking status: Never Smoker   . Smokeless tobacco: Not on file  . Alcohol Use: No  . Drug Use: Not on file  . Sexually Active: Not on file   Other Topics Concern  . Not on file   Social History Narrative  . No narrative on file    Past Surgical History  Procedure Laterality Date  . Ablation of dysrhythmic focus  11/13/2012      Medication List    ASK your doctor about these medications       aspirin EC 81 MG tablet  Take 81 mg by mouth every morning.     diltiazem 120 MG tablet  Commonly known as:  CARDIZEM  Take 120 mg by mouth every morning.     dronedarone 400 MG tablet  Commonly known as:  MULTAQ  Take 400 mg by mouth 2 (two) times daily with a meal.     ELIQUIS 5 MG Tabs tablet  Generic drug:  apixaban  Take 5 mg by mouth 2 (two) times daily.     Vitamin D (Ergocalciferol) 50000 UNITS Caps  Commonly known as:  DRISDOL  Take 50,000 Units by mouth 2 (two) times a week.        Physical Exam: Blood pressure 142/79, pulse 76, temperature 98.4 F (36.9 C), temperature source  Oral, resp. rate 20, SpO2 95.00%.  She is a pleasant white female in no acute distress. HEENT: Normocephalic, atraumatic. Pupils are equal round and reactive to light accommodation. Sclera are clear. Oropharynx is clear. Mucous membranes are moist. Neck is supple without jugular venous distention, adenopathy, thyromegaly, or bruits. Lungs: Mild wheezes in the right base. Cardiovascular: Regular rate and rhythm, normal S1 and S2, no gallop, murmur, or click. Abdomen: Obese, soft, nontender. Bowel sounds are positive. No hepatosplenomegaly. Extremities: Radial, femoral, and pedal pulses are 2+ and symmetric. Her legs appear to be mildly edematous but without pitting. No cyanosis. Skin: Warm and dry Neuro: Alert and  oriented x3. Cranial nerves II through XII are intact.  Labs:   Lab Results  Component Value Date   WBC 9.0 11/17/2012   HGB 12.4 11/17/2012   HCT 36.4 11/17/2012   MCV 86.3 11/17/2012   PLT 230 11/17/2012    Recent Labs Lab 11/17/12 1145  NA 140  K 3.5  CL 103  CO2 25  BUN 10  CREATININE 0.70  CALCIUM 9.2  PROT 6.7  BILITOT 0.5  ALKPHOS 66  ALT 70*  AST 31  GLUCOSE 96   Lab Results  Component Value Date   TROPONINI <0.30 11/17/2012      Radiology: CT ANGIOGRAPHY CHEST  Technique: Multidetector CT imaging of the chest using the  standard protocol during bolus administration of intravenous  contrast. Multiplanar reconstructed images including MIPs were  obtained and reviewed to evaluate the vascular anatomy.  Contrast: OMNIPAQUE IOHEXOL 350 MG/ML SOLN  Comparison: None.  Findings: There is a small low density filling defect in a  segmental branch of the left upper lobe on image 110 of series 10.  No large central pulmonary thromboembolism.  No obvious aortic aneurysm or dissection. A aberrant right  subclavian artery, a normal variation is noted.  Small bilateral pleural effusions right greater than left have  developed.  Negative abnormal mediastinal adenopathy.  No pneumothorax.  3 mm right upper lobe nodule on image 20. 3 mm left upper lobe  nodule on image 17.  No destructive bone lesion.  Images of the upper abdomen demonstrate an indeterminate partially  imaged lesion within the left lobe of the liver. The portion  visualized is 4.0 cm.  IMPRESSION:  This study is positive for segmental pulmonary thromboembolism in  the left upper lobe.  Bilateral pleural effusion.  3 mm of bilateral upper lobe pulmonary nodules. If the patient is  at high risk for bronchogenic carcinoma, follow-up chest CT at 1  year is recommended. If the patient is at low risk, no follow-up  is needed. This recommendation follows the consensus statement:  Guidelines for  Management of Small Pulmonary Nodules Detected on CT  Scans: A Statement from the Fleischner Society as published in  Radiology 2005; 237:395-400.  Partially imaged indeterminate left lobe liver lesion. This should  be further evaluated with either CT or MR with contrast.  Original Report Authenticated By: Jolaine Click, M.D.      CHEST - 2 VIEW  Comparison: 10/28/2012  Findings: Hyperinflation of the lungs. Heart is normal size.  Posterior opacity noted on the lateral view. This likely  represents a small right pleural effusion and right base  atelectasis. Left lung is clear. No acute bony abnormality.  IMPRESSION:  Small right pleural effusion with right base atelectasis.  Mild hyperinflation.  Original Report Authenticated By: Charlett Nose, M.D.  Lower extremity venous Doppler was negative for DVT, superficial  thrombosis, or Baker's cyst.  EKG: Normal sinus rhythm with a rate of 84 beats per minute. Nonspecific T-wave abnormality.  ASSESSMENT AND PLAN: 1. Dyspnea. I think there are 3 components to her symptoms of dyspnea. She appears to have a bronchospastic component that has been present for some time and exacerbated by beta blocker therapy. It is improved with inhalers. She also has a component of diastolic heart failure with evidence of small pleural effusions. This may be potentially exacerbated by Multaq but I suspect it is mostly related to hydration with her ablation procedure with 10 lb weight gain during that admission. She also has evidence of 2 small subsegmental pulmonary emboli in the left upper lobe. This despite being on anticoagulation for the past 3 weeks. There is really no history of pleuritic chest pain or abrupt onset of dyspnea that would correlate with timing of a pulmonary embolus. Venous Dopplers are negative for DVT. 2. Subsegmental pulmonary emboli 3. Paroxysmal atrial fibrillation. Status post recent radiofrequency ablation  Plan: Agree with recommendations  for full anticoagulation with Lovenox. Would consider alternative anticoagulation with Xarelto at discharge. We will check an echocardiogram. We'll also check a d-dimer. I would hold Multaq therapy now. Agree with diuresis with IV Lasix. I would also treat her bronchospastic disease with inhaler therapy as the primary team feels appropriate. Continue diltiazem for rate control. We will follow with you.  SignedTheron [facility] 11/17/2012, 3:56 PM

## 2012-11-17 NOTE — Progress Notes (Signed)
ANTICOAGULATION CONSULT NOTE - Initial Consult  Pharmacy Consult for Lovenox Indication: PE  Allergies  Allergen Reactions  . Erythromycin Nausea And Vomiting  . Macrobid (Nitrofurantoin Macrocrystal) Nausea And Vomiting    Vital Signs: Temp: 98.4 F (36.9 C) (04/25 1055) Temp src: Oral (04/25 1055) BP: 142/79 mmHg (04/25 1055) Pulse Rate: 76 (04/25 1055)  Labs:  Recent Labs  11/17/12 1145  HGB 12.4  HCT 36.4  PLT 230  CREATININE 0.70  TROPONINI <0.30    Medical History: Past Medical History  Diagnosis Date  . Paroxysmal atrial fibrillation   . Liver lesion     Assessment: 67 yo F presents [facility] ED on 11/17/12 with PE (CT = "segmental pulmonary thromboembolism in the left upper lobe.") . LLE doppler (-) for DVT. Pt has hx of Afib. Pt on apixaban 5mg  PO BID - last dose taken today at 8am (per Med Rec). SCr=0.7, wt=81.6kg (taken at PCP's office today?), CrCl>141ml/min. Since apixaban is currently only FDA-approved for VTE prophylaxis, but patient now has a CT-proven PE, will being Lovenox treatment now with 1mg /kg q12h.  Goal of Therapy:  Anti-Xa level 0.6-1.2 units/ml 4hrs after LMWH dose given Monitor platelets by anticoagulation protocol: Yes   Plan:  1) Lovenox 80mg  sq q12h 2) F/U long-term anticoag plans  Darrol Angel, PharmD Pager: [phone] 11/17/2012,3:03 PM

## 2012-11-17 NOTE — ED Notes (Signed)
Pt sent here from PCP to R/O DVT/PE that is possible due to her anticoagulant she is currently on.

## 2012-11-17 NOTE — Progress Notes (Signed)
Left lower extremity venous duplex completed.  Left:  No evidence of DVT, superficial thrombosis, or Baker's cyst.  Right:  Negative for DVT in the common femoral vein.  

## 2012-11-17 NOTE — ED Notes (Signed)
Patient having 3D echo at bedside.

## 2012-11-17 NOTE — Progress Notes (Signed)
*  PRELIMINARY RESULTS* Echocardiogram 2D Echocardiogram has been performed.  Tonya Stanley 11/17/2012, 4:22 PM

## 2012-11-17 NOTE — ED Notes (Signed)
MD at bedside. 

## 2012-11-17 NOTE — ED Notes (Signed)
Report called to Melissa RN

## 2012-11-17 NOTE — Progress Notes (Signed)
  Subjective:    Patient ID: Tonya Stanley, female    DOB: Aug 17, 1945, 67 y.o.   MRN: [id number]  HPI patient is a 67 year old lady who presents with shortness of breath, fluid retention, and pain behind her left knee. Pertinent history reveals the patient underwent an ablation on Monday for atrial fibrillation. She had been placed on Blood thinners prior to this procedure. She is also on a drug called multag to prevent recurrence of her atrial fibrillation. Since her visit to the cardiologist she is felt increasingly short of breath with dyspnea on exertion inability to catch her breath and feels she is retaining fluid. She has pain behind the left knee..    Review of Systems     Objective:   Physical Exam patient is alert and cooperative she is not in distress. She does have a rapid respiratory rate of 30 which decreased as she sat down and rested. Resting pulse ox was 96%. Her neck is supple. Chest exam reveals no audible rales or wheezes. Cardiac exam reveals a regular rate and rhythm. Emergent EKG shows a normal sinus rhythm with minimal ST-T changes which are nonspecific   [facility] reading (PRIMARY) by  Dr. Cleta Alberts there is fluid present in the fissures. The heart size is normal. On the lateral film please comment on the posterior portion of the x-ray to see if that is fluid or a wedge-shaped abnormality.        Assessment & Plan:  Patient presents with chest discomfort shortness of breath fluid retention and pain behind the left knee. I advised the patient that we would put her on a monitor and transport her to the hospital for evaluation. Patient is adamant that she drive herself to the hospital. I told her I would not recommend she do this . She refuses to go by EMS. She will transport herself. I asked her to call a friend to transport her and she states she cannot find anyone to take her and she will drive herself. she is also on Multaq which does have a side effect of precipitating congestive  heart failure. I am concerned possible clot in her left leg. The radiologist feels is fluid present on her chest x-ray consistent with a pleural effusion. I have concerns about possible PE because of her calf pain and popliteal pain. I did advise the patient to go by EMS for evaluation but she is adamant she is going to drive. Considerations  regarding a d-dimer, left leg ultrasound. And consideration for CT angio triage nurse called it wasn't long as they know she is coming to. There are also considerations regarding the multaq she is on and side effect of congestive heart failure. PulsOx 96 P 80 ambulatory O2 95 P 96

## 2012-11-17 NOTE — ED Provider Notes (Signed)
History     CSN: [id number]  Arrival date & time 11/17/12  1032   First MD Initiated Contact with Patient 11/17/12 1042      Chief Complaint  Patient presents with  . possible DVT/PE      HPI Pt was seen at 1045.   Per pt, c/o gradual onset and persistence of constant peripheral edema for the past 4 days.  Has been associated with SOB and left posterior proximal calf "pain." States she has "gained 10 pounds in the past 4 days."  Pt states her symptoms began after she had an ablation for atrial fibrillation in [place] 4 days ago. States she has been taking her meds, including anticoagulant (Eliquis), as prescribed for the past 2 to 3 weeks. Pt was eval at [facility], and told to come to the ED for further eval for possible "heart failure" and "blood clots."  Denies CP/palpitations, no cough, no abd pain, no N/V/D, no back pain, no fevers.    Cards: [place] Past Medical History  Diagnosis Date  . Paroxysmal atrial fibrillation   . Liver lesion     Past Surgical History  Procedure Laterality Date  . Ablation of dysrhythmic focus  11/13/2012     History  Substance Use Topics  . Smoking status: Never Smoker   . Smokeless tobacco: Not on file  . Alcohol Use: No      Review of Systems ROS: Statement: All systems negative except as marked or noted in the HPI; Constitutional: Negative for fever and chills. ; ; Eyes: Negative for eye pain, redness and discharge. ; ; ENMT: Negative for ear pain, hoarseness, nasal congestion, sinus pressure and sore throat. ; ; Cardiovascular: Negative for chest pain, palpitations, diaphoresis, +dyspnea and peripheral edema. ; ; Respiratory: Negative for cough, wheezing and stridor. ; ; Gastrointestinal: Negative for nausea, vomiting, diarrhea, abdominal pain, blood in stool, hematemesis, jaundice and rectal bleeding. . ; ; Genitourinary: Negative for dysuria, flank pain and hematuria. ; ; Musculoskeletal: +left posterior calf pain. Negative for back  pain and neck pain. Negative for trauma.; ; Skin: Negative for pruritus, rash, abrasions, blisters, bruising and skin lesion.; ; Neuro: Negative for headache, lightheadedness and neck stiffness. Negative for weakness, altered level of consciousness , altered mental status, extremity weakness, paresthesias, involuntary movement, seizure and syncope.       Allergies  Erythromycin and Macrobid  Home Medications   Current Outpatient Rx  Name  Route  Sig  Dispense  Refill  . apixaban (ELIQUIS) 5 MG TABS tablet   Oral   Take 5 mg by mouth 2 (two) times daily.         Marland Kitchen aspirin EC 81 MG tablet   Oral   Take 81 mg by mouth every morning.         . diltiazem (CARDIZEM) 120 MG tablet   Oral   Take 120 mg by mouth every morning.          . dronedarone (MULTAQ) 400 MG tablet   Oral   Take 400 mg by mouth 2 (two) times daily with a meal.         . Vitamin D, Ergocalciferol, (DRISDOL) 50000 UNITS CAPS   Oral   Take 50,000 Units by mouth 2 (two) times a week.           BP 142/79  Pulse 76  Temp(Src) 98.4 F (36.9 C) (Oral)  Resp 20  SpO2 95%  Physical Exam 1050: Physical examination:  Nursing  notes reviewed; Vital signs and O2 SAT reviewed;  Constitutional: Well developed, Well nourished, Well hydrated, In no acute distress; Head:  Normocephalic, atraumatic; Eyes: EOMI, PERRL, No scleral icterus; ENMT: Mouth and pharynx normal, Mucous membranes moist; Neck: Supple, Full range of motion, No lymphadenopathy; Cardiovascular: Regular rate and rhythm, No gallop; Respiratory: Breath sounds coarse at bases bilat, No wheezes.  Speaking full sentences with ease, Normal respiratory effort/excursion; Chest: Nontender, Movement normal; Abdomen: Soft, Nontender, Nondistended, Normal bowel sounds; Genitourinary: No CVA tenderness; Extremities: Pulses normal, +mild left popliteal fossa and proximal calf tenderness without ecchymosis or erythema. +1 pedal edema bilat without calf asymmetry. NT  left knee without edema, erythema, ecchymosis or deformity.; Neuro: AA&Ox3, Major CN grossly intact.  Speech clear. Climbs on and off stretcher by herself without distress. Gait steady. No gross focal motor or sensory deficits in extremities.; Skin: Color normal, Warm, Dry.; Psych:  Anxious.    ED Course  Procedures    1410:  Pt already aware of liver lesion.  VS remain stable, resps without distress.  Dx and testing d/w pt and family.  Questions answered.  Verb understanding, agreeable to admit.  T/C to [facility] Dr. Tenny Craw, case discussed, including:  HPI, pertinent PM/SHx, VS/PE, dx testing, ED course and treatment:  Agreeable to consult, requests to admit to medicine service. T/C to [facility] Dr. Elvera Lennox, case discussed, including:  HPI, pertinent PM/SHx, VS/PE, dx testing, ED course and treatment:  Agreeable to admit, requests to write temporary orders, obtain tele bed to team 6.    MDM  MDM Reviewed: previous chart, nursing note and vitals Reviewed previous: labs and ECG Interpretation: labs, ECG, CT scan and x-ray      Date: 11/17/2012  Rate: 84  Rhythm: normal sinus rhythm  QRS Axis: normal  Intervals: normal  ST/T Wave abnormalities: normal  Conduction Disutrbances:none  Narrative Interpretation:   Old EKG Reviewed: unchanged; no significant changes from previous EKG dated 10/28/2012.  Results for orders placed during the hospital encounter of 11/17/12  URINALYSIS, ROUTINE W REFLEX MICROSCOPIC      Result Value Range   Color, Urine YELLOW  YELLOW   APPearance CLEAR  CLEAR   Specific Gravity, Urine 1.012  1.005 - 1.030   pH 7.0  5.0 - 8.0   Glucose, UA NEGATIVE  NEGATIVE mg/dL   Hgb urine dipstick NEGATIVE  NEGATIVE   Bilirubin Urine NEGATIVE  NEGATIVE   Ketones, ur NEGATIVE  NEGATIVE mg/dL   Protein, ur NEGATIVE  NEGATIVE mg/dL   Urobilinogen, UA 1.0  0.0 - 1.0 mg/dL   Nitrite NEGATIVE  NEGATIVE   Leukocytes, UA NEGATIVE  NEGATIVE  PRO B NATRIURETIC PEPTIDE       Result Value Range   Pro B Natriuretic peptide (BNP) 209.5 (*) 0 - 125 pg/mL  TROPONIN I      Result Value Range   Troponin I <0.30  <0.30 ng/mL  CBC WITH DIFFERENTIAL      Result Value Range   WBC 9.0  4.0 - 10.5 K/uL   RBC 4.22  3.87 - 5.11 MIL/uL   Hemoglobin 12.4  12.0 - 15.0 g/dL   HCT 84.1  32.4 - 40.1 %   MCV 86.3  78.0 - 100.0 fL   MCH 29.4  26.0 - 34.0 pg   MCHC 34.1  30.0 - 36.0 g/dL   RDW 02.7  25.3 - 66.4 %   Platelets 230  150 - 400 K/uL   Neutrophils Relative 76  43 - 77 %  Neutro Abs 6.8  1.7 - 7.7 K/uL   Lymphocytes Relative 14  12 - 46 %   Lymphs Abs 1.3  0.7 - 4.0 K/uL   Monocytes Relative 9  3 - 12 %   Monocytes Absolute 0.8  0.1 - 1.0 K/uL   Eosinophils Relative 1  0 - 5 %   Eosinophils Absolute 0.1  0.0 - 0.7 K/uL   Basophils Relative 0  0 - 1 %   Basophils Absolute 0.0  0.0 - 0.1 K/uL  COMPREHENSIVE METABOLIC PANEL      Result Value Range   Sodium 140  135 - 145 mEq/L   Potassium 3.5  3.5 - 5.1 mEq/L   Chloride 103  96 - 112 mEq/L   CO2 25  19 - 32 mEq/L   Glucose, Bld 96  70 - 99 mg/dL   BUN 10  6 - 23 mg/dL   Creatinine, Ser 2.95  0.50 - 1.10 mg/dL   Calcium 9.2  8.4 - 28.4 mg/dL   Total Protein 6.7  6.0 - 8.3 g/dL   Albumin 3.5  3.5 - 5.2 g/dL   AST 31  0 - 37 U/L   ALT 70 (*) 0 - 35 U/L   Alkaline Phosphatase 66  39 - 117 U/L   Total Bilirubin 0.5  0.3 - 1.2 mg/dL   GFR calc non Af Amer 89 (*) >90 mL/min   GFR calc Af Amer >90  >90 mL/min   Dg Chest 2 View 11/17/2012  *RADIOLOGY REPORT*  Clinical Data: Shortness of breath.  CHEST - 2 VIEW  Comparison: 10/28/2012  Findings: Hyperinflation of the lungs.  Heart is normal size. Posterior opacity noted on the lateral view.  This likely represents a small right pleural effusion and right base atelectasis.  Left lung is clear.  No acute bony abnormality.  IMPRESSION: Small right pleural effusion with right base atelectasis.  Mild hyperinflation.   Original Report Authenticated By: Charlett Nose, M.D.     Ct Angio Chest W/cm &/or Wo Cm 11/17/2012  *RADIOLOGY REPORT*  Clinical Data: Short of breath.  Status post cardiac ablation 1 week ago.  Fluid retention.  CT ANGIOGRAPHY CHEST  Technique:  Multidetector CT imaging of the chest using the standard protocol during bolus administration of intravenous contrast. Multiplanar reconstructed images including MIPs were obtained and reviewed to evaluate the vascular anatomy.  Contrast: OMNIPAQUE IOHEXOL 350 MG/ML SOLN  Comparison: None.  Findings: There is a small low density filling defect in a segmental branch of the left upper lobe on image 110 of series 10. No large central pulmonary thromboembolism.  No obvious aortic aneurysm or dissection.  A aberrant right subclavian artery, a normal variation is noted.  Small bilateral pleural effusions right greater than left have developed.  Negative abnormal mediastinal adenopathy.  No pneumothorax.  3 mm right upper lobe nodule on image 20.  3 mm left upper lobe nodule on image 17.  No destructive bone lesion.  Images of the upper abdomen demonstrate an indeterminate partially imaged lesion within the left lobe of the liver.  The portion visualized is 4.0 cm.  IMPRESSION: This study is positive for segmental pulmonary thromboembolism in the left upper lobe.  Bilateral pleural effusion.  3 mm of bilateral upper lobe pulmonary nodules. If the patient is at high risk for bronchogenic carcinoma, follow-up chest CT at 1 year is recommended.  If the patient is at low risk, no follow-up is needed.  This recommendation follows the  consensus statement: Guidelines for Management of Small Pulmonary Nodules Detected on CT Scans:  A Statement from the Fleischner Society as published in Radiology 2005; 237:395-400.  Partially imaged indeterminate left lobe liver lesion. This should be further evaluated with either CT or MR with contrast.   Original Report Authenticated By: Jolaine Click, M.D.      Smiley Houseman, RVT Service:  Vascular Lab Author Type: Cardiovascular Sonographer   Filed: 11/17/2012 12:43 PM Note Time: 11/17/2012 12:42 PM         Left lower extremity venous duplex completed. Left: No evidence of DVT, superficial thrombosis, or Baker's cyst. Right: Negative for DVT in the common femoral vein.     Results for FIDELIA, CATHERS (MRN [id number]) as of 11/17/2012 15:29  Ref. Range 10/28/2012 01:18 11/17/2012 11:45  Pro B Natriuretic peptide (BNP) Latest Range: 0-125 pg/mL 57.7 209.5 (H)             Laray Anger, DO 11/18/12 1203

## 2012-11-18 DIAGNOSIS — J90 Pleural effusion, not elsewhere classified: Secondary | ICD-10-CM | POA: Diagnosis not present

## 2012-11-18 DIAGNOSIS — I4891 Unspecified atrial fibrillation: Secondary | ICD-10-CM | POA: Diagnosis not present

## 2012-11-18 DIAGNOSIS — R799 Abnormal finding of blood chemistry, unspecified: Secondary | ICD-10-CM | POA: Diagnosis not present

## 2012-11-18 DIAGNOSIS — R0609 Other forms of dyspnea: Secondary | ICD-10-CM | POA: Diagnosis not present

## 2012-11-18 LAB — BASIC METABOLIC PANEL
CO2: 28 mEq/L (ref 19–32)
Chloride: 102 mEq/L (ref 96–112)
GFR calc Af Amer: >90 mL/min (ref >90)
Potassium: 3.6 mEq/L (ref 3.5–5.1)
Sodium: 139 mEq/L (ref 135–145)

## 2012-11-18 LAB — URINE CULTURE: Culture: NO GROWTH

## 2012-11-18 MED ORDER — RIVAROXABAN 20 MG PO TABS: 20.0000 mg | ORAL_TABLET | Freq: Every day | ORAL | Status: DC

## 2012-11-18 MED ORDER — DILTIAZEM HCL ER 120 MG PO CP24: 120.0000 mg | ORAL_CAPSULE | Freq: Every day | ORAL | Status: DC

## 2012-11-18 MED ORDER — DILTIAZEM HCL ER BEADS 120 MG PO CP24: 120.0000 mg | ORAL_CAPSULE | Freq: Every day | ORAL | Status: DC

## 2012-11-18 MED ORDER — FUROSEMIDE 40 MG PO TABS: 40.0000 mg | ORAL_TABLET | Freq: Every day | ORAL | Status: DC | PRN

## 2012-11-18 MED ORDER — TRAMADOL HCL 50 MG PO TABS
50.0000 mg | ORAL_TABLET | Freq: Two times a day (BID) | ORAL | Status: DC | PRN
  Administered 2012-11-18: 50 mg via ORAL
  Filled 2012-11-18: qty 1

## 2012-11-18 MED ORDER — RIVAROXABAN 15 MG PO TABS: 15.0000 mg | ORAL_TABLET | Freq: Two times a day (BID) | ORAL | Status: DC

## 2012-11-18 NOTE — Discharge Summary (Signed)
Physician Discharge Summary  Tonya Stanley ZOX:[id number] DOB: 1945/10/07 DOA: 11/17/2012  PCP: No primary provider on file.  Admit date: 11/17/2012 Discharge date: 11/18/2012  Time spent: 45 minutes  Recommendations for Outpatient Follow-up:  1. She will need close follow up with cardiology on discharge  2. BMP recheck next week if she takes Lasix at home  Discharge Diagnoses:  Active Problems:   Atrial fibrillation   Fluid overload   Elevated brain natriuretic peptide (BNP) level   Dyspnea   Diastolic CHF, acute   Pulmonary embolus  Discharge Condition: stable  Diet recommendation: heart healthy  Filed Weights   11/17/12 1730 11/18/12 0536  Weight: 81.647 kg (180 lb) 78.1 kg (172 lb 2.9 oz)   History of present illness:  67 y.o. female has a past medical history significant for atrial fibrillation status post ablation 4 days ago, currently on Dronedarone, Apixaban, Diltiazem presents with the above mentioned symptoms. She states that since her procedure was on Monday, she has noted that she has swelling in her legs and her left knee is bothering more endorsing a 10 pound weight gain. She also is having a hard time breathing especially with exertion. Denies any palpitations or chest pain. She denies any lightheadedness. Denies any nausea or vomiting or diarrhea. Denies any blood in her urine or stool.   Hospital Course:  PE - she was initially started on Lovenox s.q which was transitioned to [place] on discharge to take 15 mg twice daily for 21 days then 20 mg daily for 6 months. She will need re-evaluation at that time regarding anticoagulation management.  Fluid overload - can be in the setting of recent procedure and if she received fluids; initial concern was for heart failure with subsequent fluid overload induced by Dronedarone, thus patient underwent a 2D echo which showed normal LV function. She received 40 mg iv Lasix on admission with excellent urine output and patient's  edema resolved in the morning as well as her breathing difficulty improved significantly. She was down almost 8 lbs per our weights. Her renal function tolerated diuresis well and potassium remained stable. In the light that she does not have heart failure induced by Dronedarone and after discussion with cardiology she should restart that medication as planned before (plan for 6 more weeks). I gave her a prn Lasix prescription to use at home and advised for daily weights. I have asked patient to set up an appointment with Cardiology as soon as possible, preferably next week and to contact her EP in [place] who did the ablation to update.  A fib - s/p ablation, now in sinus rhythm.   Procedures:  2D echo Study Conclusions - Left ventricle: The cavity size was normal. Wall thickness was normal. Systolic function was normal. The estimated ejection fraction was in the range of 60% to 65%. Wall motion was normal; there were no regional wall motion abnormalities. - Pulmonary arteries: Systolic pressure was mildly increased. PA peak pressure: 41mm Hg (S).  Consultations:  Cardiology  Discharge Exam: Filed Vitals:   11/17/12 1055 11/17/12 1730 11/17/12 2027 11/18/12 0536  BP: 142/79 138/80 118/77 122/72  Pulse: 76 80 83 80  Temp: 98.4 F (36.9 C) 98 F (36.7 C) 98 F (36.7 C) 97.9 F (36.6 C)  TempSrc: Oral Oral Oral Oral  Resp: 20 18 16 12   Height:  5' 6.5" (1.689 m)    Weight:  81.647 kg (180 lb)  78.1 kg (172 lb 2.9 oz)  SpO2: 95%  96% 96% 94%   General: NAD, anxious Cardiovascular: RRR without MRG Respiratory: CTA biL  Discharge Instructions   Future Appointments Provider Department Dept Phone   12/13/2012 9:30 AM [facility] [phone]   12/13/2012 10:00 AM [facility] [phone]   NPO after midnight   12/14/2012 10:00 AM [facility] 2 [place]  [phone]   NPO after midnight   12/19/2012 11:30 AM Duke Salvia, MD [facility] Main [facility] [place]) [phone]       Medication List    STOP taking these medications       ELIQUIS 5 MG Tabs tablet  Generic drug:  apixaban      TAKE these medications       aspirin EC 81 MG tablet  Take 81 mg by mouth every morning.     diltiazem 120 MG 24 hr capsule  Commonly known as:  TIAZAC  Take 1 capsule (120 mg total) by mouth daily.     dronedarone 400 MG tablet  Commonly known as:  MULTAQ  Take 400 mg by mouth 2 (two) times daily with a meal.     furosemide 40 MG tablet  Commonly known as:  LASIX  Take 1 tablet (40 mg total) by mouth daily as needed (Weight gain > 3 lbs in a day or fluid buildup).     Rivaroxaban 15 MG Tabs tablet  Commonly known as:  XARELTO  Take 1 tablet (15 mg total) by mouth 2 (two) times daily.     Rivaroxaban 15 MG Tabs tablet  Commonly known as:  XARELTO  Take 1 tablet (15 mg total) by mouth 2 (two) times daily.     Rivaroxaban 20 MG Tabs  Commonly known as:  XARELTO  Take 1 tablet (20 mg total) by mouth daily. Take after 3 weeks of taking 15 mg twice daily.     Vitamin D (Ergocalciferol) 50000 UNITS Caps  Commonly known as:  DRISDOL  Take 50,000 Units by mouth 2 (two) times a week.           Follow-up Information   Follow up with Sherryl Manges, MD. Schedule an appointment as soon as possible for a visit in 1 week.   Contact information:   [address] [place] [address] [phone]       Follow up with Primary care provider. Schedule an appointment as soon as possible for a visit in 1 week.     The results of significant diagnostics from this hospitalization (including imaging, microbiology, ancillary and laboratory) are listed below for reference.    Significant Diagnostic Studies: Dg Chest 2 View  11/17/2012  *RADIOLOGY REPORT*  Clinical Data: Shortness of breath.  CHEST - 2 VIEW  Comparison:  10/28/2012  Findings: Hyperinflation of the lungs.  Heart is normal size. Posterior opacity noted on the lateral view.  This likely represents a small right pleural effusion and right base atelectasis.  Left lung is clear.  No acute bony abnormality.  IMPRESSION: Small right pleural effusion with right base atelectasis.  Mild hyperinflation.   Original Report Authenticated By: Charlett Nose, M.D.    Dg Chest 2 View (if Patient Has Fever And/or Copd)  10/28/2012  *RADIOLOGY REPORT*  Clinical Data: Shortness of breath.  CHEST - 2 VIEW  Comparison: None.  Findings: Heart size is normal.  The lungs are free of focal consolidations and pleural effusions.  No pulmonary edema.  There is mild anterior wedging of the T12 vertebral body, of indeterminate age.  Mild degenerative changes are identified in the thoracic spine.  IMPRESSION: No evidence for acute cardiopulmonary abnormality.   Original Report Authenticated By: Norva Pavlov, M.D.    Ct Angio Chest W/cm &/or Wo Cm  11/17/2012  *RADIOLOGY REPORT*  Clinical Data: Short of breath.  Status post cardiac ablation 1 week ago.  Fluid retention.  CT ANGIOGRAPHY CHEST  Technique:  Multidetector CT imaging of the chest using the standard protocol during bolus administration of intravenous contrast. Multiplanar reconstructed images including MIPs were obtained and reviewed to evaluate the vascular anatomy.  Contrast: OMNIPAQUE IOHEXOL 350 MG/ML SOLN  Comparison: None.  Findings: There is a small low density filling defect in a segmental branch of the left upper lobe on image 110 of series 10. No large central pulmonary thromboembolism.  No obvious aortic aneurysm or dissection.  A aberrant right subclavian artery, a normal variation is noted.  Small bilateral pleural effusions right greater than left have developed.  Negative abnormal mediastinal adenopathy.  No pneumothorax.  3 mm right upper lobe nodule on image 20.  3 mm left upper lobe nodule on image 17.  No  destructive bone lesion.  Images of the upper abdomen demonstrate an indeterminate partially imaged lesion within the left lobe of the liver.  The portion visualized is 4.0 cm.  IMPRESSION: This study is positive for segmental pulmonary thromboembolism in the left upper lobe.  Bilateral pleural effusion.  3 mm of bilateral upper lobe pulmonary nodules. If the patient is at high risk for bronchogenic carcinoma, follow-up chest CT at 1 year is recommended.  If the patient is at low risk, no follow-up is needed.  This recommendation follows the consensus statement: Guidelines for Management of Small Pulmonary Nodules Detected on CT Scans:  A Statement from the Fleischner Society as published in Radiology 2005; 237:395-400.  Partially imaged indeterminate left lobe liver lesion. This should be further evaluated with either CT or MR with contrast.   Original Report Authenticated By: Jolaine Click, M.D.     Microbiology: Recent Results (from the past 240 hour(s))  URINE CULTURE     Status: None   Collection Time    11/17/12 11:47 AM      Result Value Range Status   Specimen Description URINE, CLEAN CATCH   Final   Special Requests NONE   Final   Culture  Setup Time 11/17/2012 15:55   Final   Colony Count NO GROWTH   Final   Culture NO GROWTH   Final   Report Status 11/18/2012 FINAL   Final    Labs: Basic Metabolic Panel:  Recent Labs Lab 11/17/12 1145 11/18/12 0545  NA 140 139  K 3.5 3.6  CL 103 102  CO2 25 28  GLUCOSE 96 85  BUN 10 8  CREATININE 0.70 0.73  CALCIUM 9.2 9.2   Liver Function Tests:  Recent Labs Lab 11/17/12 1145  AST 31  ALT 70*  ALKPHOS 66  BILITOT 0.5  PROT 6.7  ALBUMIN 3.5   CBC:  Recent Labs Lab 11/17/12 1145  WBC 9.0  NEUTROABS 6.8  HGB 12.4  HCT 36.4  MCV 86.3  PLT 230   Cardiac Enzymes:  Recent Labs Lab 11/17/12 1145  TROPONINI <0.30   BNP: BNP (last 3 results)  Recent Labs  10/28/12 0118 11/17/12 1145  PROBNP 57.7 209.5*      Signed:  GHERGHE, COSTIN  [facility] 11/18/2012,  12:23 PM

## 2012-11-18 NOTE — Care Management Note (Signed)
Cm consulted with patient at bedside concerning pt discharging home on Xarelto. Cm confirmed pt has Medicare Part D prescription coverage. Pt eligible for Xarelto 10 copay assistance coverage card. Pt instructed to contact number provided on card to activate card. Pt advised card may required activation on Monday. MD advised to provide pt with two rx to covr her until card is activated. Pt's spouse present during consult to assist with home care. Pt independent PTA, able to tolerate diet. No other needs assessed.   Roxy Manns Delonte Musich,RN,BSN [phone]

## 2012-11-18 NOTE — Progress Notes (Signed)
Patient ID: Tonya Stanley, female   DOB: 1946-03-07, 67 y.o.   MRN: [id number]   Please refer to the complete consult note done by cardiology late yesterday. 2-D echo shows good LV function with an ejection fraction of 60%. Right ventricular size and function are normal.  Jerral Bonito, MD

## 2012-11-20 ENCOUNTER — Telehealth: Payer: Self-pay | Admitting: Internal Medicine

## 2012-11-20 NOTE — Telephone Encounter (Signed)
New Prob     Pt recently released from hospital and was told she needs to be seen/squeezed in this week by Dr. Graciela Husbands. Pt would like to speak to nurse regarding this.

## 2012-11-20 NOTE — Telephone Encounter (Signed)
Spoke with pt, Follow up scheduled  

## 2012-11-22 ENCOUNTER — Ambulatory Visit: Payer: Self-pay | Admitting: Internal Medicine

## 2012-11-24 ENCOUNTER — Ambulatory Visit (INDEPENDENT_AMBULATORY_CARE_PROVIDER_SITE_OTHER): Payer: Medicare Other | Admitting: Internal Medicine

## 2012-11-24 ENCOUNTER — Encounter: Payer: Self-pay | Admitting: Internal Medicine

## 2012-11-24 VITALS — BP 120/77 | HR 78 | Ht 66.0 in | Wt 173.0 lb

## 2012-11-24 DIAGNOSIS — I2699 Other pulmonary embolism without acute cor pulmonale: Secondary | ICD-10-CM | POA: Diagnosis not present

## 2012-11-24 DIAGNOSIS — I4891 Unspecified atrial fibrillation: Secondary | ICD-10-CM | POA: Diagnosis not present

## 2012-11-24 NOTE — Progress Notes (Signed)
Patient Care Team: Sigmund Hazel, MD as PCP - General (Family Medicine)   HPI  Tonya Stanley is a 67 y.o. female Seen  following catheter ablation of atrial fibrillation at [facility] Done April 2014.  The procedure went quite smoothly in conversations today with Dr. Delena Serve. The postoperative course was notable for fluid retention prompting hospitalization. Her d-dimer was elevated, not surprisingly and she underwent CT scanning demonstrating small clots. She is currently being treated with Rivaroxaban She's feeling much better. There is some emotional lability. Past Medical History  Diagnosis Date  . Paroxysmal atrial fibrillation   . Liver lesion     Past Surgical History  Procedure Laterality Date  . Ablation of dysrhythmic focus  11/13/2012  . Colonoscopy    . Tonsillectomy      Current Outpatient Prescriptions  Medication Sig Dispense Refill  . aspirin EC 81 MG tablet Take 81 mg by mouth every morning.      . diltiazem (TIAZAC) 120 MG 24 hr capsule Take 1 capsule (120 mg total) by mouth daily.  90 capsule  1  . dronedarone (MULTAQ) 400 MG tablet Take 400 mg by mouth 2 (two) times daily with a meal.      . furosemide (LASIX) 40 MG tablet Take 1 tablet (40 mg total) by mouth daily as needed (Weight gain > 3 lbs in a day or fluid buildup).  30 tablet  0  . Rivaroxaban (XARELTO) 15 MG TABS tablet Take 1 tablet (15 mg total) by mouth 2 (two) times daily.  6 tablet  0  . Rivaroxaban (XARELTO) 15 MG TABS tablet Take 1 tablet (15 mg total) by mouth 2 (two) times daily.  36 tablet  0  . Rivaroxaban (XARELTO) 20 MG TABS Take 1 tablet (20 mg total) by mouth daily. Take after 3 weeks of taking 15 mg twice daily.  30 tablet  5  . Vitamin D, Ergocalciferol, (DRISDOL) 50000 UNITS CAPS Take 50,000 Units by mouth 2 (two) times a week.       No current facility-administered medications for this visit.    Allergies  Allergen Reactions  . Erythromycin Nausea And Vomiting  . Macrobid (Nitrofurantoin  Macrocrystal) Nausea And Vomiting    Review of Systems negative except from HPI and PMH  Physical Exam BP 120/77  Pulse 78  Ht 5\' 6"  (1.676 m)  Wt 173 lb (78.472 kg)  BMI 27.94 kg/m2 Well developed and well nourished in no acute distress HENT normal E scleral and icterus clear Neck Supple JVP flat; carotids brisk and full Clear to ausculation  Regular rate and rhythm, no murmurs gallops or rub Soft with active bowel sounds No clubbing cyanosis none Edema Alert and oriented, grossly normal motor and sensory function Skin Warm and Dry  ECG demonstrates sinus rhythm at 78 Intervals 07/02/39 Otherwise normal  Assessment and  Plan

## 2012-11-24 NOTE — Assessment & Plan Note (Signed)
This may or may not be real.i 'll try to have the radiologist review it. The d-dimer in the setting of catheter procedures are  abnormally elevated inevitably and clots or not it makes apprise

## 2012-11-24 NOTE — Assessment & Plan Note (Signed)
She is status post catheter ablation of atrial fibrillation. She a very rapid conduction. After discussions with Dr. Delena Serve, we will continue her on diltiazem. She is quite willing to continue the dronaderone. It is not likely that contributed to her volume retention.  She's having some emotional lability following the procedure. It is not clear that this is related to the dronaderone. A quick computer search failed to show this point. I suspect it relates to the stress of her procedure

## 2012-12-04 ENCOUNTER — Telehealth: Payer: Self-pay | Admitting: Internal Medicine

## 2012-12-04 NOTE — Telephone Encounter (Signed)
Spoke with pt to followup the question regarding Pulm embolism post AF procedure   Reviewed the films with Dr LF from radiology  She would like to resume apixoban, and I think that that is reasonable.  The bleeding risk should be less the non inferiority trial vs warfarin used 10 mg bid for 7 days, and she has been using Rivaroxaban bid for now 2 weeks

## 2012-12-13 ENCOUNTER — Other Ambulatory Visit: Payer: Medicare Other

## 2012-12-13 ENCOUNTER — Encounter (HOSPITAL_COMMUNITY)
Admission: RE | Admit: 2012-12-13 | Discharge: 2012-12-13 | Disposition: A | Discharge status: Home / Self Care | Payer: Medicare Other | Source: Ambulatory Visit | Attending: Family Medicine | Admitting: Family Medicine

## 2012-12-13 ENCOUNTER — Ambulatory Visit (HOSPITAL_COMMUNITY)
Admission: RE | Admit: 2012-12-13 | Discharge: 2012-12-13 | Disposition: A | Discharge status: Home / Self Care | Payer: Medicare Other | Source: Ambulatory Visit | Attending: Family Medicine | Admitting: Family Medicine

## 2012-12-13 DIAGNOSIS — E049 Nontoxic goiter, unspecified: Secondary | ICD-10-CM

## 2012-12-13 DIAGNOSIS — E042 Nontoxic multinodular goiter: Secondary | ICD-10-CM | POA: Diagnosis not present

## 2012-12-13 DIAGNOSIS — E0520 Thyrotoxicosis with toxic multinodular goiter without thyrotoxic crisis or storm: Secondary | ICD-10-CM | POA: Insufficient documentation

## 2012-12-13 DIAGNOSIS — E0590 Thyrotoxicosis, unspecified without thyrotoxic crisis or storm: Secondary | ICD-10-CM

## 2012-12-14 ENCOUNTER — Encounter (HOSPITAL_COMMUNITY)
Admission: RE | Admit: 2012-12-14 | Discharge: 2012-12-14 | Disposition: A | Discharge status: Home / Self Care | Payer: Medicare Other | Source: Ambulatory Visit | Attending: Family Medicine | Admitting: Family Medicine

## 2012-12-14 DIAGNOSIS — E049 Nontoxic goiter, unspecified: Secondary | ICD-10-CM | POA: Diagnosis not present

## 2012-12-14 MED ORDER — SODIUM IODIDE I 131 CAPSULE
15.1000 | Freq: Once | INTRAVENOUS | Status: AC | PRN
  Administered 2012-12-14: 15.1 via ORAL

## 2012-12-14 MED ORDER — SODIUM PERTECHNETATE TC 99M INJECTION
8.9000 | Freq: Once | INTRAVENOUS | Status: AC | PRN
  Administered 2012-12-14: 9 via INTRAVENOUS

## 2012-12-15 ENCOUNTER — Other Ambulatory Visit: Payer: Self-pay | Admitting: Family Medicine

## 2012-12-15 DIAGNOSIS — E041 Nontoxic single thyroid nodule: Secondary | ICD-10-CM

## 2012-12-19 ENCOUNTER — Telehealth: Payer: Self-pay | Admitting: Internal Medicine

## 2012-12-19 ENCOUNTER — Ambulatory Visit: Payer: Self-pay | Admitting: Internal Medicine

## 2012-12-19 NOTE — Telephone Encounter (Signed)
Spoke with tammy, pt is having thyroid biopsy and will need to hold xarelto. Will discuss with dr Graciela Husbands.

## 2012-12-19 NOTE — Telephone Encounter (Signed)
New problem   Can patient stop eliquis for  2 days.

## 2012-12-20 NOTE — Telephone Encounter (Signed)
Discussed with dr Graciela Husbands, okay given for pt to hold xarelto the day before the procedure and she will restart after the biopsy. tammy at [facility] made aware and will forward this note to her.

## 2012-12-21 ENCOUNTER — Telehealth: Payer: Self-pay | Admitting: *Deleted

## 2012-12-21 NOTE — Telephone Encounter (Signed)
Spoke with Tonya Stanley at [facility] imaging, according to the pt she is taking eliquis 5 mg bid not xarelto. Also the pt had a PE post ablation. Discussed with dr Graciela Husbands, the pt is out about 6 weeks from the PE, he would like for her to be about 8 weeks out before we stop the eliquis. Will forward this note to Tonya Stanley.

## 2012-12-26 ENCOUNTER — Encounter: Payer: Self-pay | Admitting: Internal Medicine

## 2013-01-16 DIAGNOSIS — R946 Abnormal results of thyroid function studies: Secondary | ICD-10-CM | POA: Diagnosis not present

## 2013-01-16 DIAGNOSIS — E042 Nontoxic multinodular goiter: Secondary | ICD-10-CM | POA: Diagnosis not present

## 2013-01-30 ENCOUNTER — Other Ambulatory Visit: Payer: Medicare Other

## 2013-02-28 ENCOUNTER — Other Ambulatory Visit: Payer: Self-pay

## 2013-03-12 DIAGNOSIS — I4891 Unspecified atrial fibrillation: Secondary | ICD-10-CM | POA: Diagnosis not present

## 2013-03-12 DIAGNOSIS — I2699 Other pulmonary embolism without acute cor pulmonale: Secondary | ICD-10-CM | POA: Diagnosis not present

## 2013-03-14 DIAGNOSIS — K7689 Other specified diseases of liver: Secondary | ICD-10-CM | POA: Diagnosis not present

## 2013-03-14 DIAGNOSIS — I4891 Unspecified atrial fibrillation: Secondary | ICD-10-CM | POA: Diagnosis not present

## 2013-03-14 DIAGNOSIS — I2699 Other pulmonary embolism without acute cor pulmonale: Secondary | ICD-10-CM | POA: Diagnosis not present

## 2013-03-14 DIAGNOSIS — E042 Nontoxic multinodular goiter: Secondary | ICD-10-CM | POA: Diagnosis not present

## 2013-03-14 DIAGNOSIS — Z23 Encounter for immunization: Secondary | ICD-10-CM | POA: Diagnosis not present

## 2013-03-23 ENCOUNTER — Other Ambulatory Visit: Payer: Self-pay | Admitting: Family Medicine

## 2013-03-23 DIAGNOSIS — K7689 Other specified diseases of liver: Secondary | ICD-10-CM

## 2013-03-30 ENCOUNTER — Ambulatory Visit
Admission: RE | Admit: 2013-03-30 | Discharge: 2013-03-30 | Disposition: A | Discharge status: Home / Self Care | Payer: Medicare Other | Source: Ambulatory Visit | Attending: Family Medicine | Admitting: Family Medicine

## 2013-03-30 DIAGNOSIS — K7689 Other specified diseases of liver: Secondary | ICD-10-CM

## 2013-04-06 ENCOUNTER — Telehealth: Payer: Self-pay | Admitting: Internal Medicine

## 2013-04-06 ENCOUNTER — Other Ambulatory Visit: Payer: Self-pay | Admitting: *Deleted

## 2013-04-06 NOTE — Telephone Encounter (Signed)
New Problem  General practice advised her to stop taking eloquist.. wanting to know if Dr. Graciela Husbands agrees with this... if agreed wants to know how to apply dosage.

## 2013-04-06 NOTE — Telephone Encounter (Signed)
Spoke with patient who informed she stopped taking Eliquis recently because her general practitioner told her to. She wanted to make sure with Dr. Graciela Husbands that this was ok.  In talking with her she explained to me what medication she was on - she only takes aspirin and vitamin D currently. I will fix her medication list and review this with Dr. Graciela Husbands upon his return on Tuesday.  Patient is aware that he wont be in until Tuesday. Patient is agreeable to plan.

## 2013-04-10 NOTE — Telephone Encounter (Signed)
Patient informed is ok to stop Eliquis, pt agreeable to plan

## 2013-04-19 DIAGNOSIS — E042 Nontoxic multinodular goiter: Secondary | ICD-10-CM | POA: Diagnosis not present

## 2013-04-24 DIAGNOSIS — E559 Vitamin D deficiency, unspecified: Secondary | ICD-10-CM | POA: Diagnosis not present

## 2013-04-24 DIAGNOSIS — E042 Nontoxic multinodular goiter: Secondary | ICD-10-CM | POA: Diagnosis not present

## 2013-04-24 DIAGNOSIS — R946 Abnormal results of thyroid function studies: Secondary | ICD-10-CM | POA: Diagnosis not present

## 2013-05-31 ENCOUNTER — Other Ambulatory Visit: Payer: Self-pay

## 2013-06-05 DIAGNOSIS — Z1231 Encounter for screening mammogram for malignant neoplasm of breast: Secondary | ICD-10-CM | POA: Diagnosis not present

## 2013-06-05 DIAGNOSIS — Z124 Encounter for screening for malignant neoplasm of cervix: Secondary | ICD-10-CM | POA: Diagnosis not present

## 2013-06-05 DIAGNOSIS — Z130 Encounter for screening for diseases of the blood and blood-forming organs and certain disorders involving the immune mechanism: Secondary | ICD-10-CM | POA: Diagnosis not present

## 2013-06-29 ENCOUNTER — Encounter: Payer: Self-pay | Admitting: Internal Medicine

## 2013-06-29 ENCOUNTER — Ambulatory Visit (INDEPENDENT_AMBULATORY_CARE_PROVIDER_SITE_OTHER): Payer: Medicare Other | Admitting: Internal Medicine

## 2013-06-29 VITALS — BP 128/76 | HR 83 | Ht 66.5 in | Wt 178.8 lb

## 2013-06-29 DIAGNOSIS — I4891 Unspecified atrial fibrillation: Secondary | ICD-10-CM | POA: Diagnosis not present

## 2013-06-29 NOTE — Patient Instructions (Signed)
Your physician recommends that you continue on your current medications as directed. Please refer to the Current Medication list given to you today.  No follow up with Dr. Graciela Husbands is needed

## 2013-06-29 NOTE — Progress Notes (Signed)
      Patient Care Team: Sigmund Hazel, MD as PCP - General (Family Medicine)   HPI  Tonya Stanley is a 67 y.o. female Seen following catheter ablation of atrial fibrillation at [facility]  Done April 2014. The procedure went quite smoothly in conversations today with Dr. Delena Serve. The postoperative course was notable for fluid retention prompting hospitalization. Her d-dimer was elevated, not surprisingly and she underwent CT scanning demonstrating small clots. She was treated with Rivaroxaban  She's feeling much better she feels like it has been a miracle.   Past Medical History  Diagnosis Date  . Paroxysmal atrial fibrillation   . Liver lesion     Past Surgical History  Procedure Laterality Date  . Ablation of dysrhythmic focus  11/13/2012  . Colonoscopy    . Tonsillectomy      Current Outpatient Prescriptions  Medication Sig Dispense Refill  . aspirin EC 81 MG tablet Take 81 mg by mouth every morning.      . Vitamin D, Ergocalciferol, (DRISDOL) 50000 UNITS CAPS Take 50,000 Units by mouth 2 (two) times a week.       No current facility-administered medications for this visit.    Allergies  Allergen Reactions  . Erythromycin Nausea And Vomiting  . Macrobid [Nitrofurantoin Macrocrystal] Nausea And Vomiting    Review of Systems negative except from HPI and PMH  Physical Exam BP 128/76  Pulse 83  Ht 5' 6.5" (1.689 m)  Wt 178 lb 12.8 oz (81.103 kg)  BMI 28.43 kg/m2     Assessment and  Plan

## 2013-06-29 NOTE — Assessment & Plan Note (Signed)
No recurrent atrial fibrillation. We will discontinue her aspirin. We will see her as needed. I have touch base with Dr. Ronn Melena. She would like [place] to see her on a weekly we will followup with her

## 2013-09-07 DIAGNOSIS — M79609 Pain in unspecified limb: Secondary | ICD-10-CM | POA: Diagnosis not present

## 2013-09-19 DIAGNOSIS — I8310 Varicose veins of unspecified lower extremity with inflammation: Secondary | ICD-10-CM | POA: Diagnosis not present

## 2013-10-03 DIAGNOSIS — I8310 Varicose veins of unspecified lower extremity with inflammation: Secondary | ICD-10-CM | POA: Diagnosis not present

## 2013-10-03 DIAGNOSIS — I872 Venous insufficiency (chronic) (peripheral): Secondary | ICD-10-CM | POA: Diagnosis not present

## 2013-10-03 DIAGNOSIS — M79609 Pain in unspecified limb: Secondary | ICD-10-CM | POA: Diagnosis not present

## 2013-10-05 DIAGNOSIS — I8310 Varicose veins of unspecified lower extremity with inflammation: Secondary | ICD-10-CM | POA: Diagnosis not present

## 2013-10-05 DIAGNOSIS — I872 Venous insufficiency (chronic) (peripheral): Secondary | ICD-10-CM | POA: Diagnosis not present

## 2013-10-05 DIAGNOSIS — M79609 Pain in unspecified limb: Secondary | ICD-10-CM | POA: Diagnosis not present

## 2013-10-16 DIAGNOSIS — M79609 Pain in unspecified limb: Secondary | ICD-10-CM | POA: Diagnosis not present

## 2013-10-16 DIAGNOSIS — I8310 Varicose veins of unspecified lower extremity with inflammation: Secondary | ICD-10-CM | POA: Diagnosis not present

## 2013-10-23 DIAGNOSIS — E042 Nontoxic multinodular goiter: Secondary | ICD-10-CM | POA: Diagnosis not present

## 2013-10-23 DIAGNOSIS — E559 Vitamin D deficiency, unspecified: Secondary | ICD-10-CM | POA: Diagnosis not present

## 2013-10-25 DIAGNOSIS — E559 Vitamin D deficiency, unspecified: Secondary | ICD-10-CM | POA: Diagnosis not present

## 2013-10-25 DIAGNOSIS — E042 Nontoxic multinodular goiter: Secondary | ICD-10-CM | POA: Diagnosis not present

## 2013-10-25 DIAGNOSIS — R946 Abnormal results of thyroid function studies: Secondary | ICD-10-CM | POA: Diagnosis not present

## 2013-10-30 DIAGNOSIS — M79609 Pain in unspecified limb: Secondary | ICD-10-CM | POA: Diagnosis not present

## 2013-10-30 DIAGNOSIS — I8310 Varicose veins of unspecified lower extremity with inflammation: Secondary | ICD-10-CM | POA: Diagnosis not present

## 2013-10-30 DIAGNOSIS — M7981 Nontraumatic hematoma of soft tissue: Secondary | ICD-10-CM | POA: Diagnosis not present

## 2013-11-04 IMAGING — CR DG CHEST 2V
2 series · 2 of 2 positions shown · non-contrast
Comparison: 10/28/2012

CLINICAL DATA: Shortness of breath.

CHEST - 2 VIEW

[PA]
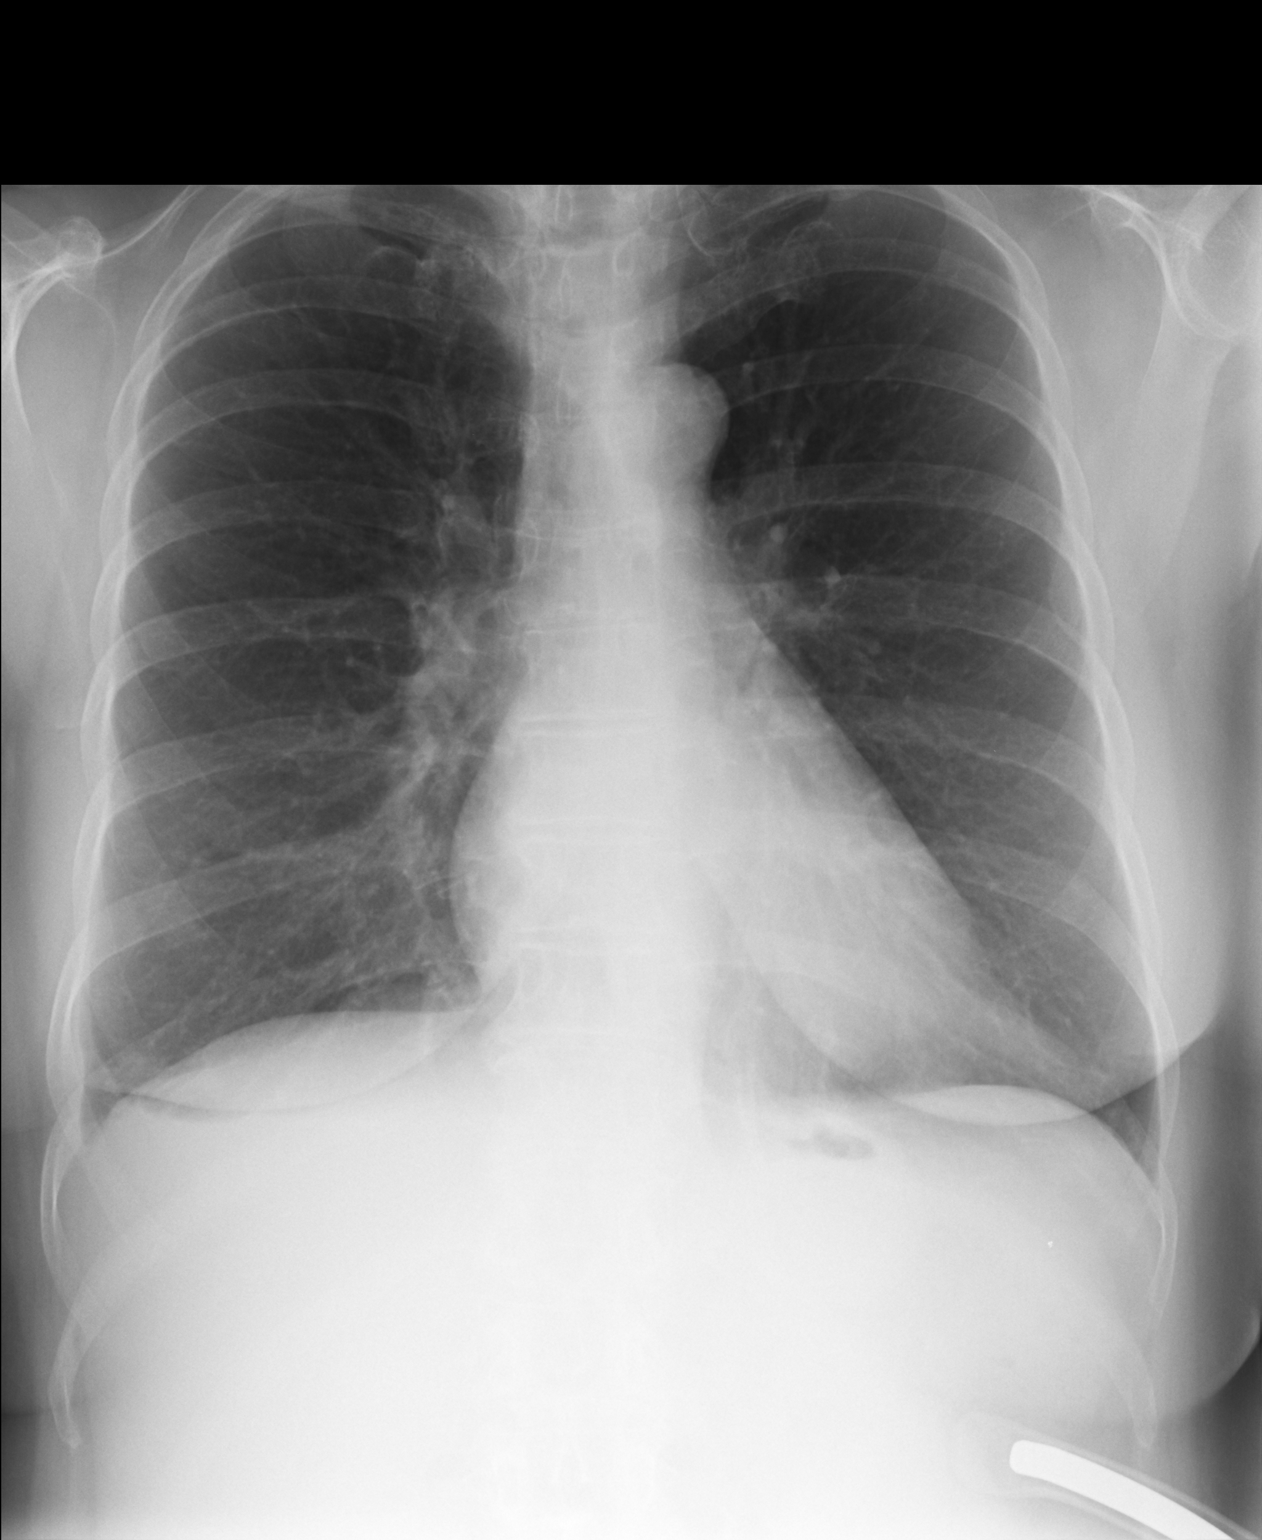

[lateral]
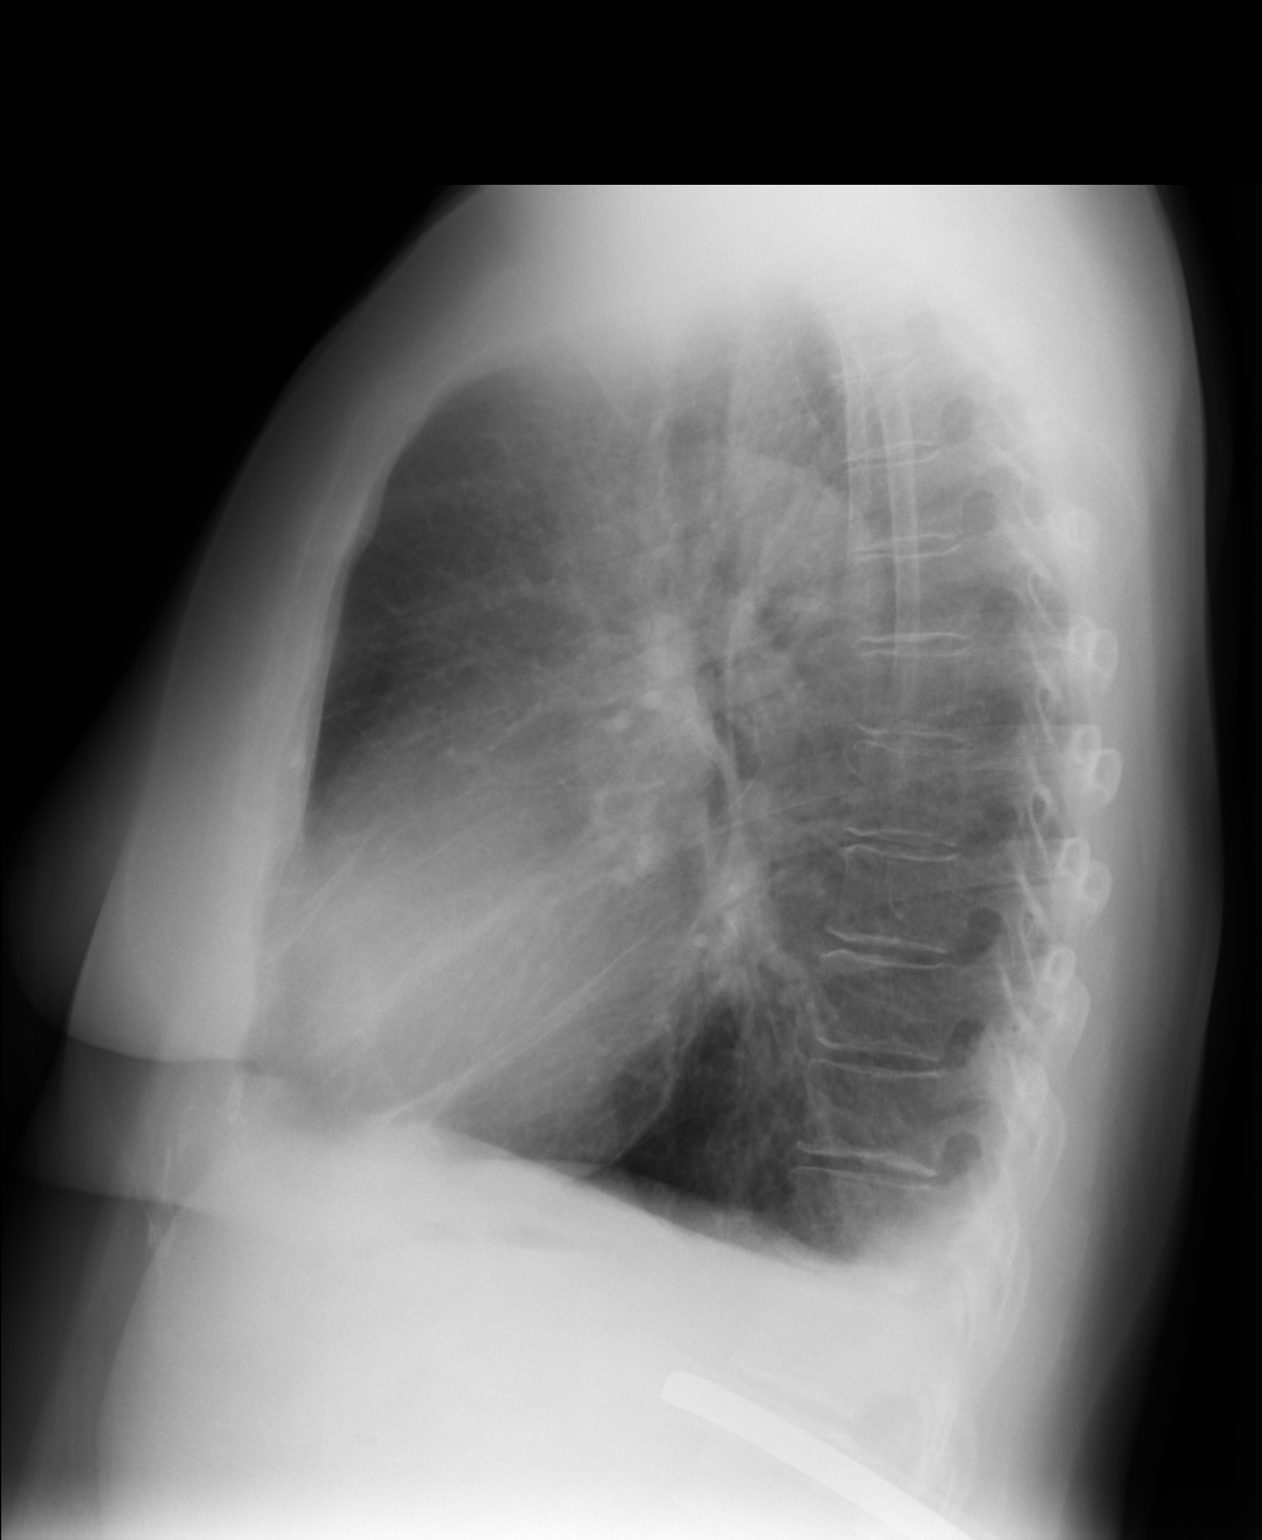

[2 of 2 positions shown; findings below may reference images not displayed]

FINDINGS: Hyperinflation of the lungs.  Heart is normal size.
Posterior opacity noted on the lateral view.  This likely
represents a small right pleural effusion and right base
atelectasis.  Left lung is clear.  No acute bony abnormality.
IMPRESSION: Small right pleural effusion with right base atelectasis.

Mild hyperinflation.

## 2014-03-01 DIAGNOSIS — G43909 Migraine, unspecified, not intractable, without status migrainosus: Secondary | ICD-10-CM | POA: Diagnosis not present

## 2014-03-01 DIAGNOSIS — H25019 Cortical age-related cataract, unspecified eye: Secondary | ICD-10-CM | POA: Diagnosis not present

## 2014-03-21 DIAGNOSIS — G43909 Migraine, unspecified, not intractable, without status migrainosus: Secondary | ICD-10-CM | POA: Diagnosis not present

## 2014-04-11 DIAGNOSIS — M79609 Pain in unspecified limb: Secondary | ICD-10-CM | POA: Diagnosis not present

## 2014-04-23 DIAGNOSIS — I8310 Varicose veins of unspecified lower extremity with inflammation: Secondary | ICD-10-CM | POA: Diagnosis not present

## 2014-04-25 DIAGNOSIS — E042 Nontoxic multinodular goiter: Secondary | ICD-10-CM | POA: Diagnosis not present

## 2014-04-25 DIAGNOSIS — E559 Vitamin D deficiency, unspecified: Secondary | ICD-10-CM | POA: Diagnosis not present

## 2014-05-02 DIAGNOSIS — E559 Vitamin D deficiency, unspecified: Secondary | ICD-10-CM | POA: Diagnosis not present

## 2014-05-02 DIAGNOSIS — Z23 Encounter for immunization: Secondary | ICD-10-CM | POA: Diagnosis not present

## 2014-05-02 DIAGNOSIS — E042 Nontoxic multinodular goiter: Secondary | ICD-10-CM | POA: Diagnosis not present

## 2014-05-10 ENCOUNTER — Other Ambulatory Visit: Payer: Self-pay

## 2014-05-28 DIAGNOSIS — M79604 Pain in right leg: Secondary | ICD-10-CM | POA: Diagnosis not present

## 2014-05-28 DIAGNOSIS — I8311 Varicose veins of right lower extremity with inflammation: Secondary | ICD-10-CM | POA: Diagnosis not present

## 2014-05-28 DIAGNOSIS — M79651 Pain in right thigh: Secondary | ICD-10-CM | POA: Diagnosis not present

## 2014-06-11 DIAGNOSIS — M7981 Nontraumatic hematoma of soft tissue: Secondary | ICD-10-CM | POA: Diagnosis not present

## 2014-06-11 DIAGNOSIS — M79604 Pain in right leg: Secondary | ICD-10-CM | POA: Diagnosis not present

## 2014-06-11 DIAGNOSIS — M79651 Pain in right thigh: Secondary | ICD-10-CM | POA: Diagnosis not present

## 2014-07-02 DIAGNOSIS — M25562 Pain in left knee: Secondary | ICD-10-CM | POA: Diagnosis not present

## 2014-07-02 DIAGNOSIS — M25462 Effusion, left knee: Secondary | ICD-10-CM | POA: Diagnosis not present

## 2014-07-02 DIAGNOSIS — M1712 Unilateral primary osteoarthritis, left knee: Secondary | ICD-10-CM | POA: Diagnosis not present

## 2014-08-09 DIAGNOSIS — M722 Plantar fascial fibromatosis: Secondary | ICD-10-CM | POA: Diagnosis not present

## 2014-10-07 DIAGNOSIS — E042 Nontoxic multinodular goiter: Secondary | ICD-10-CM | POA: Diagnosis not present

## 2014-10-07 DIAGNOSIS — Z23 Encounter for immunization: Secondary | ICD-10-CM | POA: Diagnosis not present

## 2014-10-07 DIAGNOSIS — J45990 Exercise induced bronchospasm: Secondary | ICD-10-CM | POA: Diagnosis not present

## 2014-11-11 DIAGNOSIS — E559 Vitamin D deficiency, unspecified: Secondary | ICD-10-CM | POA: Diagnosis not present

## 2014-11-11 DIAGNOSIS — E042 Nontoxic multinodular goiter: Secondary | ICD-10-CM | POA: Diagnosis not present

## 2014-11-19 DIAGNOSIS — E042 Nontoxic multinodular goiter: Secondary | ICD-10-CM | POA: Diagnosis not present

## 2014-11-19 DIAGNOSIS — E559 Vitamin D deficiency, unspecified: Secondary | ICD-10-CM | POA: Diagnosis not present

## 2015-01-20 ENCOUNTER — Other Ambulatory Visit: Payer: Self-pay

## 2015-02-13 ENCOUNTER — Ambulatory Visit (INDEPENDENT_AMBULATORY_CARE_PROVIDER_SITE_OTHER): Payer: Medicare Other | Admitting: Family Medicine

## 2015-02-13 DIAGNOSIS — L03114 Cellulitis of left upper limb: Secondary | ICD-10-CM

## 2015-02-13 MED ORDER — SULFAMETHOXAZOLE-TRIMETHOPRIM 800-160 MG PO TABS: 2.0000 | ORAL_TABLET | Freq: Two times a day (BID) | ORAL | Status: DC

## 2015-02-13 MED ORDER — CEPHALEXIN 500 MG PO CAPS: 500.0000 mg | ORAL_CAPSULE | Freq: Four times a day (QID) | ORAL | Status: DC

## 2015-02-13 NOTE — Progress Notes (Signed)
Subjective:    Patient ID: Tonya Stanley, female    DOB: Jun 03, 1946, 69 y.o.   MRN: [id number]  02/13/2015  possible insect bite   HPI This 69 y.o. female presents for possible insect bite to L arm/elbow.  Onset in past day.  Denies fever/chills/sweats.  +pain to area. Denies itching.  Outside a lot.  No apparent insect bite to area.  No axillary LAD.  Applied heat to area.  Grandson with MRSA recently.  No drainage.   Review of Systems  Constitutional: Negative for fever, chills, diaphoresis and fatigue.  Musculoskeletal: Positive for myalgias. Negative for joint swelling and arthralgias.  Skin: Positive for color change and wound. Negative for rash.  Neurological: Negative for weakness and numbness.    Past Medical History  Diagnosis Date  . Paroxysmal atrial fibrillation     RFAblation 4/14 [facility]  . Liver lesion   . Pulmonary embolism     post RFCA atrial fib  [facility] 4/14   Past Surgical History  Procedure Laterality Date  . Ablation of dysrhythmic focus  11/13/2012  . Colonoscopy    . Tonsillectomy     Allergies  Allergen Reactions  . Erythromycin Nausea And Vomiting  . Macrobid [Nitrofurantoin Macrocrystal] Nausea And Vomiting   Current Outpatient Prescriptions  Medication Sig Dispense Refill  . aspirin EC 81 MG tablet Take 81 mg by mouth every morning.    . cephALEXin (KEFLEX) 500 MG capsule Take 1 capsule (500 mg total) by mouth 4 (four) times daily. 40 capsule 0  . sulfamethoxazole-trimethoprim (BACTRIM DS,SEPTRA DS) 800-160 MG per tablet Take 2 tablets by mouth 2 (two) times daily. 40 tablet 0  . Vitamin D, Ergocalciferol, (DRISDOL) 50000 UNITS CAPS Take 50,000 Units by mouth 2 (two) times a week.     No current facility-administered medications for this visit.       Objective:    BP 140/82 mmHg  Pulse 88  Temp(Src) 98.5 F (36.9 C) (Oral)  Resp 18  Ht 5' 6.5" (1.689 m)  Wt 183 lb 9.6 oz (83.28 kg)  BMI 29.19 kg/m2  SpO2 97% Physical Exam    Constitutional: She is oriented to person, place, and time. She appears well-developed and well-nourished. No distress.  HENT:  Head: Normocephalic and atraumatic.  Eyes: Conjunctivae are normal. Pupils are equal, round, and reactive to light.  Neck: Normal range of motion. Neck supple.  Cardiovascular: Intact distal pulses.   Pulmonary/Chest: Effort normal and breath sounds normal.  Musculoskeletal:       Left elbow: Normal. She exhibits normal range of motion, no swelling, no effusion, no deformity and no laceration. No tenderness found. No medial epicondyle, no lateral epicondyle and no olecranon process tenderness noted.       Left wrist: Normal. She exhibits normal range of motion, no tenderness, no bony tenderness, no swelling and no effusion.       Arms: Lymphadenopathy:       Left axillary: No pectoral and no lateral adenopathy present. Neurological: She is alert and oriented to person, place, and time.  Skin: She is not diaphoretic. There is erythema.  Diffuse erythema L elbow 12 cm x 9 cm.  No fluctuants; +central pustule with surrounding 1 cm of induration; no streaking.   Psychiatric: She has a normal mood and affect. Her behavior is normal.  Nursing note and vitals reviewed.       Assessment & Plan:   1. Cellulitis of arm, left    -New. -Rx for  Keflex and Bactrim provided. -Elevate arm; apply heat to arm bid for 20 minutes. -RTC for fever, increasing redness/swelling/pain.   Meds ordered this encounter  Medications  . cephALEXin (KEFLEX) 500 MG capsule    Sig: Take 1 capsule (500 mg total) by mouth 4 (four) times daily.    Dispense:  40 capsule    Refill:  0  . sulfamethoxazole-trimethoprim (BACTRIM DS,SEPTRA DS) 800-160 MG per tablet    Sig: Take 2 tablets by mouth 2 (two) times daily.    Dispense:  40 tablet    Refill:  0    No Follow-up on file.     Ryley Bachtel Elayne Guerin, M.D. [facility] [address] [place],  [place]  [phone] phone [phone] fax

## 2015-02-13 NOTE — Patient Instructions (Signed)
1. Return in the upcoming 24-72 hours if you develop fever/chills/sweats or if redness  and pain worsens.  Cellulitis Cellulitis is an infection of the skin and the tissue beneath it. The infected area is usually red and tender. Cellulitis occurs most often in the arms and lower legs.  CAUSES  Cellulitis is caused by bacteria that enter the skin through cracks or cuts in the skin. The most common types of bacteria that cause cellulitis are staphylococci and streptococci. SIGNS AND SYMPTOMS   Redness and warmth.  Swelling.  Tenderness or pain.  Fever. DIAGNOSIS  Your health care provider can usually determine what is wrong based on a physical exam. Blood tests may also be done. TREATMENT  Treatment usually involves taking an antibiotic medicine. HOME CARE INSTRUCTIONS   Take your antibiotic medicine as directed by your health care provider. Finish the antibiotic even if you start to feel better.  Keep the infected arm or leg elevated to reduce swelling.  Apply a warm cloth to the affected area up to 4 times per day to relieve pain.  Take medicines only as directed by your health care provider.  Keep all follow-up visits as directed by your health care provider. SEEK MEDICAL CARE IF:   You notice red streaks coming from the infected area.  Your red area gets larger or turns dark in color.  Your bone or joint underneath the infected area becomes painful after the skin has healed.  Your infection returns in the same area or another area.  You notice a swollen bump in the infected area.  You develop new symptoms.  You have a fever. SEEK IMMEDIATE MEDICAL CARE IF:   You feel very sleepy.  You develop vomiting or diarrhea.  You have a general ill feeling (malaise) with muscle aches and pains. MAKE SURE YOU:   Understand these instructions.  Will watch your condition.  Will get help right away if you are not doing well or get worse. Document Released: 04/21/2005  Document Revised: 11/26/2013 Document Reviewed: 09/27/2011 [facility] Patient Information 2015 [place], [place]. This information is not intended to replace advice given to you by your health care provider. Make sure you discuss any questions you have with your health care provider.

## 2015-02-16 LAB — WOUND CULTURE
Gram Stain: NONE SEEN
Gram Stain: NONE SEEN

## 2015-02-17 DIAGNOSIS — L03114 Cellulitis of left upper limb: Secondary | ICD-10-CM | POA: Diagnosis not present

## 2015-02-18 DIAGNOSIS — L03114 Cellulitis of left upper limb: Secondary | ICD-10-CM | POA: Diagnosis not present

## 2015-05-20 DIAGNOSIS — E042 Nontoxic multinodular goiter: Secondary | ICD-10-CM | POA: Diagnosis not present

## 2015-05-20 DIAGNOSIS — E559 Vitamin D deficiency, unspecified: Secondary | ICD-10-CM | POA: Diagnosis not present

## 2015-05-22 DIAGNOSIS — E042 Nontoxic multinodular goiter: Secondary | ICD-10-CM | POA: Diagnosis not present

## 2015-05-22 DIAGNOSIS — E559 Vitamin D deficiency, unspecified: Secondary | ICD-10-CM | POA: Diagnosis not present

## 2015-05-22 DIAGNOSIS — Z23 Encounter for immunization: Secondary | ICD-10-CM | POA: Diagnosis not present

## 2015-12-19 DIAGNOSIS — H43812 Vitreous degeneration, left eye: Secondary | ICD-10-CM | POA: Diagnosis not present

## 2015-12-24 DIAGNOSIS — T148 Other injury of unspecified body region: Secondary | ICD-10-CM | POA: Diagnosis not present

## 2016-01-13 DIAGNOSIS — E559 Vitamin D deficiency, unspecified: Secondary | ICD-10-CM | POA: Diagnosis not present

## 2016-01-13 DIAGNOSIS — E042 Nontoxic multinodular goiter: Secondary | ICD-10-CM | POA: Diagnosis not present

## 2016-01-15 DIAGNOSIS — Z23 Encounter for immunization: Secondary | ICD-10-CM | POA: Diagnosis not present

## 2016-01-15 DIAGNOSIS — E042 Nontoxic multinodular goiter: Secondary | ICD-10-CM | POA: Diagnosis not present

## 2016-01-15 DIAGNOSIS — E559 Vitamin D deficiency, unspecified: Secondary | ICD-10-CM | POA: Diagnosis not present

## 2016-01-23 DIAGNOSIS — H4312 Vitreous hemorrhage, left eye: Secondary | ICD-10-CM | POA: Diagnosis not present

## 2016-01-23 DIAGNOSIS — Z0100 Encounter for examination of eyes and vision without abnormal findings: Secondary | ICD-10-CM | POA: Diagnosis not present

## 2016-04-22 DIAGNOSIS — J45990 Exercise induced bronchospasm: Secondary | ICD-10-CM | POA: Diagnosis not present

## 2016-04-22 DIAGNOSIS — E042 Nontoxic multinodular goiter: Secondary | ICD-10-CM | POA: Diagnosis not present

## 2016-04-22 DIAGNOSIS — Z6828 Body mass index (BMI) 28.0-28.9, adult: Secondary | ICD-10-CM | POA: Diagnosis not present

## 2016-04-22 DIAGNOSIS — E663 Overweight: Secondary | ICD-10-CM | POA: Diagnosis not present

## 2016-05-12 DIAGNOSIS — Z23 Encounter for immunization: Secondary | ICD-10-CM | POA: Diagnosis not present

## 2016-05-24 DIAGNOSIS — J45990 Exercise induced bronchospasm: Secondary | ICD-10-CM | POA: Diagnosis not present

## 2016-05-24 DIAGNOSIS — Z1159 Encounter for screening for other viral diseases: Secondary | ICD-10-CM | POA: Diagnosis not present

## 2016-05-24 DIAGNOSIS — E663 Overweight: Secondary | ICD-10-CM | POA: Diagnosis not present

## 2016-05-24 DIAGNOSIS — E042 Nontoxic multinodular goiter: Secondary | ICD-10-CM | POA: Diagnosis not present

## 2016-05-24 DIAGNOSIS — Z9889 Other specified postprocedural states: Secondary | ICD-10-CM | POA: Diagnosis not present

## 2016-05-24 DIAGNOSIS — Z0000 Encounter for general adult medical examination without abnormal findings: Secondary | ICD-10-CM | POA: Diagnosis not present

## 2016-05-24 DIAGNOSIS — Z6827 Body mass index (BMI) 27.0-27.9, adult: Secondary | ICD-10-CM | POA: Diagnosis not present

## 2016-05-24 DIAGNOSIS — Z131 Encounter for screening for diabetes mellitus: Secondary | ICD-10-CM | POA: Diagnosis not present

## 2016-05-25 DIAGNOSIS — R768 Other specified abnormal immunological findings in serum: Secondary | ICD-10-CM | POA: Diagnosis not present

## 2016-06-22 DIAGNOSIS — Z1231 Encounter for screening mammogram for malignant neoplasm of breast: Secondary | ICD-10-CM | POA: Diagnosis not present

## 2016-06-22 DIAGNOSIS — N958 Other specified menopausal and perimenopausal disorders: Secondary | ICD-10-CM | POA: Diagnosis not present

## 2016-06-22 DIAGNOSIS — Z124 Encounter for screening for malignant neoplasm of cervix: Secondary | ICD-10-CM | POA: Diagnosis not present

## 2016-06-22 DIAGNOSIS — Z01419 Encounter for gynecological examination (general) (routine) without abnormal findings: Secondary | ICD-10-CM | POA: Diagnosis not present

## 2016-06-22 DIAGNOSIS — Z6828 Body mass index (BMI) 28.0-28.9, adult: Secondary | ICD-10-CM | POA: Diagnosis not present

## 2016-09-14 DIAGNOSIS — E042 Nontoxic multinodular goiter: Secondary | ICD-10-CM | POA: Diagnosis present

## 2016-09-14 DIAGNOSIS — E559 Vitamin D deficiency, unspecified: Secondary | ICD-10-CM | POA: Diagnosis not present

## 2016-09-14 DIAGNOSIS — Z9889 Other specified postprocedural states: Secondary | ICD-10-CM | POA: Diagnosis not present

## 2017-03-25 DIAGNOSIS — E042 Nontoxic multinodular goiter: Secondary | ICD-10-CM | POA: Diagnosis not present

## 2017-03-25 DIAGNOSIS — E559 Vitamin D deficiency, unspecified: Secondary | ICD-10-CM | POA: Diagnosis not present

## 2017-03-29 DIAGNOSIS — E042 Nontoxic multinodular goiter: Secondary | ICD-10-CM | POA: Diagnosis not present

## 2017-03-29 DIAGNOSIS — E559 Vitamin D deficiency, unspecified: Secondary | ICD-10-CM | POA: Diagnosis not present

## 2017-03-31 DIAGNOSIS — R05 Cough: Secondary | ICD-10-CM | POA: Diagnosis not present

## 2017-03-31 DIAGNOSIS — R300 Dysuria: Secondary | ICD-10-CM | POA: Diagnosis not present

## 2017-05-24 DIAGNOSIS — K648 Other hemorrhoids: Secondary | ICD-10-CM | POA: Diagnosis not present

## 2017-05-24 DIAGNOSIS — Z86010 Personal history of colonic polyps: Secondary | ICD-10-CM | POA: Diagnosis not present

## 2017-05-24 DIAGNOSIS — K56699 Other intestinal obstruction unspecified as to partial versus complete obstruction: Secondary | ICD-10-CM | POA: Diagnosis not present

## 2017-05-24 DIAGNOSIS — Z1211 Encounter for screening for malignant neoplasm of colon: Secondary | ICD-10-CM | POA: Diagnosis not present

## 2017-05-24 DIAGNOSIS — K624 Stenosis of anus and rectum: Secondary | ICD-10-CM | POA: Diagnosis not present

## 2017-06-29 DIAGNOSIS — D125 Benign neoplasm of sigmoid colon: Secondary | ICD-10-CM | POA: Diagnosis not present

## 2017-06-29 DIAGNOSIS — K56699 Other intestinal obstruction unspecified as to partial versus complete obstruction: Secondary | ICD-10-CM | POA: Diagnosis not present

## 2017-07-15 DIAGNOSIS — K5904 Chronic idiopathic constipation: Secondary | ICD-10-CM | POA: Diagnosis not present

## 2017-07-15 DIAGNOSIS — Z860101 Personal history of adenomatous and serrated colon polyps: Secondary | ICD-10-CM

## 2017-07-15 DIAGNOSIS — Z86010 Personal history of colonic polyps: Secondary | ICD-10-CM

## 2017-07-15 HISTORY — DX: Personal history of colonic polyps: Z86.010

## 2017-07-15 HISTORY — DX: Personal history of adenomatous and serrated colon polyps: Z86.0101

## 2017-09-07 DIAGNOSIS — Z23 Encounter for immunization: Secondary | ICD-10-CM | POA: Diagnosis not present

## 2017-09-07 DIAGNOSIS — Z9889 Other specified postprocedural states: Secondary | ICD-10-CM | POA: Diagnosis not present

## 2017-09-07 DIAGNOSIS — M8580 Other specified disorders of bone density and structure, unspecified site: Secondary | ICD-10-CM | POA: Diagnosis not present

## 2017-09-07 DIAGNOSIS — E781 Pure hyperglyceridemia: Secondary | ICD-10-CM | POA: Diagnosis not present

## 2017-09-07 DIAGNOSIS — E042 Nontoxic multinodular goiter: Secondary | ICD-10-CM | POA: Diagnosis not present

## 2017-09-07 DIAGNOSIS — Z1389 Encounter for screening for other disorder: Secondary | ICD-10-CM | POA: Diagnosis not present

## 2017-09-07 DIAGNOSIS — Z131 Encounter for screening for diabetes mellitus: Secondary | ICD-10-CM | POA: Diagnosis not present

## 2017-09-07 DIAGNOSIS — J45990 Exercise induced bronchospasm: Secondary | ICD-10-CM | POA: Diagnosis not present

## 2017-09-07 DIAGNOSIS — E663 Overweight: Secondary | ICD-10-CM | POA: Diagnosis not present

## 2017-09-07 DIAGNOSIS — E559 Vitamin D deficiency, unspecified: Secondary | ICD-10-CM | POA: Diagnosis not present

## 2017-09-07 DIAGNOSIS — Z0000 Encounter for general adult medical examination without abnormal findings: Secondary | ICD-10-CM | POA: Diagnosis not present

## 2017-09-07 DIAGNOSIS — Z136 Encounter for screening for cardiovascular disorders: Secondary | ICD-10-CM | POA: Diagnosis not present

## 2017-09-08 DIAGNOSIS — Z6828 Body mass index (BMI) 28.0-28.9, adult: Secondary | ICD-10-CM | POA: Diagnosis not present

## 2017-09-08 DIAGNOSIS — Z1231 Encounter for screening mammogram for malignant neoplasm of breast: Secondary | ICD-10-CM | POA: Diagnosis not present

## 2017-09-08 DIAGNOSIS — Z01419 Encounter for gynecological examination (general) (routine) without abnormal findings: Secondary | ICD-10-CM | POA: Diagnosis not present

## 2017-09-14 DIAGNOSIS — M5136 Other intervertebral disc degeneration, lumbar region: Secondary | ICD-10-CM | POA: Diagnosis not present

## 2017-09-14 DIAGNOSIS — M25562 Pain in left knee: Secondary | ICD-10-CM | POA: Diagnosis not present

## 2017-09-14 DIAGNOSIS — M25552 Pain in left hip: Secondary | ICD-10-CM | POA: Diagnosis not present

## 2017-09-14 DIAGNOSIS — M79652 Pain in left thigh: Secondary | ICD-10-CM | POA: Diagnosis not present

## 2017-09-19 DIAGNOSIS — E042 Nontoxic multinodular goiter: Secondary | ICD-10-CM | POA: Diagnosis not present

## 2017-09-19 DIAGNOSIS — E559 Vitamin D deficiency, unspecified: Secondary | ICD-10-CM | POA: Diagnosis not present

## 2017-09-26 DIAGNOSIS — E559 Vitamin D deficiency, unspecified: Secondary | ICD-10-CM | POA: Diagnosis not present

## 2017-09-26 DIAGNOSIS — E042 Nontoxic multinodular goiter: Secondary | ICD-10-CM | POA: Diagnosis not present

## 2017-09-30 ENCOUNTER — Emergency Department (HOSPITAL_COMMUNITY): Payer: Medicare Other

## 2017-09-30 ENCOUNTER — Encounter (HOSPITAL_COMMUNITY): Payer: Self-pay | Admitting: Emergency Medicine

## 2017-09-30 ENCOUNTER — Inpatient Hospital Stay (HOSPITAL_COMMUNITY)
Admission: EM | Admit: 2017-09-30 | Discharge: 2017-10-02 | DRG: 392 | Disposition: A | Discharge status: Home / Self Care | Payer: Medicare Other | Attending: Family Medicine | Admitting: Family Medicine

## 2017-09-30 DIAGNOSIS — R1032 Left lower quadrant pain: Secondary | ICD-10-CM | POA: Diagnosis not present

## 2017-09-30 DIAGNOSIS — R197 Diarrhea, unspecified: Secondary | ICD-10-CM | POA: Diagnosis not present

## 2017-09-30 DIAGNOSIS — K56600 Partial intestinal obstruction, unspecified as to cause: Secondary | ICD-10-CM

## 2017-09-30 DIAGNOSIS — Z86711 Personal history of pulmonary embolism: Secondary | ICD-10-CM | POA: Diagnosis not present

## 2017-09-30 DIAGNOSIS — D72829 Elevated white blood cell count, unspecified: Secondary | ICD-10-CM | POA: Diagnosis present

## 2017-09-30 DIAGNOSIS — Z79899 Other long term (current) drug therapy: Secondary | ICD-10-CM

## 2017-09-30 DIAGNOSIS — Z881 Allergy status to other antibiotic agents status: Secondary | ICD-10-CM | POA: Diagnosis not present

## 2017-09-30 DIAGNOSIS — R112 Nausea with vomiting, unspecified: Secondary | ICD-10-CM | POA: Diagnosis not present

## 2017-09-30 DIAGNOSIS — I480 Paroxysmal atrial fibrillation: Secondary | ICD-10-CM | POA: Diagnosis not present

## 2017-09-30 DIAGNOSIS — R1084 Generalized abdominal pain: Secondary | ICD-10-CM | POA: Diagnosis not present

## 2017-09-30 DIAGNOSIS — I10 Essential (primary) hypertension: Secondary | ICD-10-CM | POA: Diagnosis present

## 2017-09-30 DIAGNOSIS — K2970 Gastritis, unspecified, without bleeding: Secondary | ICD-10-CM | POA: Diagnosis not present

## 2017-09-30 DIAGNOSIS — K56609 Unspecified intestinal obstruction, unspecified as to partial versus complete obstruction: Secondary | ICD-10-CM | POA: Diagnosis present

## 2017-09-30 DIAGNOSIS — R1013 Epigastric pain: Secondary | ICD-10-CM | POA: Diagnosis not present

## 2017-09-30 DIAGNOSIS — K529 Noninfective gastroenteritis and colitis, unspecified: Secondary | ICD-10-CM | POA: Diagnosis present

## 2017-09-30 DIAGNOSIS — A084 Viral intestinal infection, unspecified: Principal | ICD-10-CM | POA: Diagnosis present

## 2017-09-30 DIAGNOSIS — I4891 Unspecified atrial fibrillation: Secondary | ICD-10-CM | POA: Diagnosis present

## 2017-09-30 DIAGNOSIS — Z4682 Encounter for fitting and adjustment of non-vascular catheter: Secondary | ICD-10-CM | POA: Diagnosis not present

## 2017-09-30 DIAGNOSIS — Z0189 Encounter for other specified special examinations: Secondary | ICD-10-CM

## 2017-09-30 DIAGNOSIS — R1031 Right lower quadrant pain: Secondary | ICD-10-CM | POA: Diagnosis not present

## 2017-09-30 DIAGNOSIS — K5669 Other partial intestinal obstruction: Secondary | ICD-10-CM | POA: Diagnosis not present

## 2017-09-30 LAB — COMPREHENSIVE METABOLIC PANEL
ALBUMIN: 4.2 g/dL (ref 3.5–5.0)
ALK PHOS: 80 U/L (ref 38–126)
ALT: 20 U/L (ref 14–54)
AST: 23 U/L (ref 15–41)
Anion gap: 13 (ref 5–15)
BILIRUBIN TOTAL: 0.7 mg/dL (ref 0.3–1.2)
BUN: 15 mg/dL (ref 6–20)
CALCIUM: 9.6 mg/dL (ref 8.9–10.3)
CO2: 22 mmol/L (ref 22–32)
Chloride: 104 mmol/L (ref 101–111)
Creatinine, Ser: 0.79 mg/dL (ref 0.44–1.00)
GFR calc Af Amer: >60 mL/min (ref >60)
GLUCOSE: 196 mg/dL — AB (ref 65–99)
POTASSIUM: 4 mmol/L (ref 3.5–5.1)
Sodium: 139 mmol/L (ref 135–145)
TOTAL PROTEIN: 7.2 g/dL (ref 6.5–8.1)

## 2017-09-30 LAB — CBC
HEMATOCRIT: 45.1 % (ref 36.0–46.0)
Hemoglobin: 15 g/dL (ref 12.0–15.0)
MCH: 29 pg (ref 26.0–34.0)
MCHC: 33.3 g/dL (ref 30.0–36.0)
MCV: 87.1 fL (ref 78.0–100.0)
PLATELETS: 305 10*3/uL (ref 150–400)
RBC: 5.18 MIL/uL — ABNORMAL HIGH (ref 3.87–5.11)
RDW: 13.3 % (ref 11.5–15.5)
WBC: 16.4 10*3/uL — AB (ref 4.0–10.5)

## 2017-09-30 LAB — LIPASE, BLOOD: Lipase: 25 U/L (ref 11–51)

## 2017-09-30 MED ORDER — METOCLOPRAMIDE HCL 5 MG/ML IJ SOLN
5.0000 mg | Freq: Once | INTRAMUSCULAR | Status: AC
  Administered 2017-10-01: 5 mg via INTRAVENOUS
  Filled 2017-09-30: qty 2

## 2017-09-30 MED ORDER — MORPHINE SULFATE (PF) 4 MG/ML IV SOLN
4.0000 mg | Freq: Once | INTRAVENOUS | Status: AC
  Administered 2017-10-01: 4 mg via INTRAVENOUS
  Filled 2017-09-30: qty 1

## 2017-09-30 MED ORDER — SODIUM CHLORIDE 0.9 % IJ SOLN
INTRAMUSCULAR | Status: AC
  Filled 2017-09-30: qty 50

## 2017-09-30 MED ORDER — IOPAMIDOL (ISOVUE-300) INJECTION 61%
INTRAVENOUS | Status: AC
  Administered 2017-09-30: 100 mL
  Filled 2017-09-30: qty 100

## 2017-09-30 MED ORDER — DIPHENHYDRAMINE HCL 50 MG/ML IJ SOLN
12.5000 mg | Freq: Once | INTRAMUSCULAR | Status: AC
  Administered 2017-10-01: 12.5 mg via INTRAVENOUS
  Filled 2017-09-30: qty 1

## 2017-09-30 NOTE — ED Provider Notes (Signed)
Tonya Stanley Note   CSN: [id number] Arrival date & time: 09/30/17  2222     History   Chief Complaint Chief Complaint  Patient presents with  . Abdominal Pain  . Nausea  . Emesis    HPI Tonya Stanley is a 72 y.o. female.  72 year old female presents with double hours of epigastric abdominal pain that now radiates down to the lower quadrants.  Has had nonbilious emesis without diarrhea.  She denies any urinary symptoms.  No flank pain.  No anginal type symptoms.  No fever or chills.  Has a known history of chronic stricture.  Has remote history of paroxysmal atrial fib.  She is status post ablation.  Pain is dull and persistent.  Nothing makes it better or worse no treatment used prior to arrival.      Past Medical History:  Diagnosis Date  . Liver lesion   . Paroxysmal atrial fibrillation ([place])    RFAblation 4/14 [facility]  . Pulmonary embolism ([place])    post RFCA atrial fib  [facility] 4/14    Patient Active Problem List   Diagnosis Date Noted  . Atrial fibrillation ([place]) 06/28/2012    Past Surgical History:  Procedure Laterality Date  . ABLATION OF DYSRHYTHMIC FOCUS  11/13/2012  . COLONOSCOPY    . TONSILLECTOMY      OB History    No data available       Home Medications    Prior to Admission medications   Medication Sig Start Date End Date Taking? Authorizing Stanley  Biotin 1000 MCG tablet Take 1,000 mcg by mouth daily.   Yes Stanley, Historical, MD  Cholecalciferol (VITAMIN D3) 1000 units CAPS Take 4,000 Units by mouth daily.   Yes Stanley, Historical, MD  MAGNESIUM PO Take 1 capsule by mouth daily.   Yes Stanley, Historical, MD  Omega-3 Fatty Acids (FISH OIL) 1000 MG CAPS Take 1,000 mg by mouth daily.   Yes Stanley, Historical, MD    Family History No family history on file.  Social History Social History   Tobacco Use  . Smoking status: Never Smoker  . Smokeless tobacco: Never Used  Substance Use Topics    . Alcohol use: No  . Drug use: No     Allergies   Erythromycin and Macrobid [nitrofurantoin macrocrystal]   Review of Systems Review of Systems  All other systems reviewed and are negative.    Physical Exam Updated Vital Signs BP (!) 161/91 (BP Location: Right Arm)   Pulse 90   Temp 97.9 F (36.6 C) (Oral)   Resp 20   Wt 81.6 kg (180 lb)   SpO2 96%   BMI 28.62 kg/m   Physical Exam  Constitutional: She is oriented to person, place, and time. She appears well-developed and well-nourished.  Non-toxic appearance. No distress.  HENT:  Head: Normocephalic and atraumatic.  Eyes: Conjunctivae, EOM and lids are normal. Pupils are equal, round, and reactive to light.  Neck: Normal range of motion. Neck supple. No tracheal deviation present. No thyroid mass present.  Cardiovascular: Normal rate, regular rhythm and normal heart sounds. Exam reveals no gallop.  No murmur heard. Pulmonary/Chest: Effort normal and breath sounds normal. No stridor. No respiratory distress. She has no decreased breath sounds. She has no wheezes. She has no rhonchi. She has no rales.  Abdominal: Soft. Normal appearance and bowel sounds are normal. She exhibits distension. There is tenderness in the right lower quadrant and left lower quadrant. There is  no rebound and no CVA tenderness.    Musculoskeletal: Normal range of motion. She exhibits no edema or tenderness.  Neurological: She is alert and oriented to person, place, and time. She has normal strength. No cranial nerve deficit or sensory deficit. GCS eye subscore is 4. GCS verbal subscore is 5. GCS motor subscore is 6.  Skin: Skin is warm and dry. No abrasion and no rash noted.  Psychiatric: She has a normal mood and affect. Her speech is normal and behavior is normal.  Nursing note and vitals reviewed.    ED Treatments / Results  Labs (all labs ordered are listed, but only abnormal results are displayed) Labs Reviewed  COMPREHENSIVE METABOLIC  PANEL - Abnormal; Notable for the following components:      Result Value   Glucose, Bld 196 (*)    All other components within normal limits  CBC - Abnormal; Notable for the following components:   WBC 16.4 (*)    RBC 5.18 (*)    All other components within normal limits  LIPASE, BLOOD  URINALYSIS, ROUTINE W REFLEX MICROSCOPIC  TROPONIN I    EKG  EKG Interpretation  Date/Time:  Friday September 30 2017 22:35:36 EST Ventricular Rate:  91 PR Interval:    QRS Duration: 92 QT Interval:  397 QTC Calculation: 489 R Axis:   59 Text Interpretation:  Ectopic atrial rhythm Borderline short PR interval Anteroseptal infarct, age indeterminate Confirmed by Lacretia Leigh ([id number]) on 09/30/2017 11:28:18 PM       Radiology No results found.  Procedures Procedures (including critical care time)  Medications Ordered in ED Medications  morphine 4 MG/ML injection 4 mg (not administered)  metoCLOPramide (REGLAN) injection 5 mg (not administered)  diphenhydrAMINE (BENADRYL) injection 12.5 mg (not administered)  iopamidol (ISOVUE-300) 61 % injection (not administered)  sodium chloride 0.9 % injection (not administered)     Initial Impression / Assessment and Plan / ED Course  I have reviewed the triage vital signs and the nursing notes.  Pertinent labs & imaging results that were available during my care of the patient were reviewed by me and considered in my medical decision making (see chart for details).     Patient medicated for pain and nausea.  Given IV fluids.  Abdominal CT shows partial small bowel obstruction.  Patient had NG tube inserted by nursing.  Discussed with hospitalist and patient to be admitted  Final Clinical Impressions(s) / ED Diagnoses   Final diagnoses:  None    ED Discharge Orders    None       Lacretia Leigh, MD 10/01/17 [phone]

## 2017-09-30 NOTE — ED Notes (Signed)
Bed: WA21 Expected date:  Expected time:  Means of arrival:  Comments: EMS 72 yo female upper abd pain nausea and vomiting/IV Zofran BP 180/100

## 2017-09-30 NOTE — ED Triage Notes (Signed)
Patient arrives by [facility] from home with complaints of abdominal pain at 1600 this afternoon-vomiting and diarrhea started at 2000. PIV started and zofran 4 mg IV admnistered. BP 154/94 HR 90 CBG 230 O2 sat 94% RA

## 2017-10-01 ENCOUNTER — Other Ambulatory Visit: Payer: Self-pay

## 2017-10-01 ENCOUNTER — Encounter (HOSPITAL_COMMUNITY): Payer: Self-pay | Admitting: *Deleted

## 2017-10-01 ENCOUNTER — Emergency Department (HOSPITAL_COMMUNITY): Payer: Medicare Other

## 2017-10-01 DIAGNOSIS — A084 Viral intestinal infection, unspecified: Secondary | ICD-10-CM | POA: Diagnosis present

## 2017-10-01 DIAGNOSIS — K56600 Partial intestinal obstruction, unspecified as to cause: Secondary | ICD-10-CM

## 2017-10-01 DIAGNOSIS — Z79899 Other long term (current) drug therapy: Secondary | ICD-10-CM | POA: Diagnosis not present

## 2017-10-01 DIAGNOSIS — I480 Paroxysmal atrial fibrillation: Secondary | ICD-10-CM | POA: Diagnosis present

## 2017-10-01 DIAGNOSIS — Z4682 Encounter for fitting and adjustment of non-vascular catheter: Secondary | ICD-10-CM | POA: Diagnosis not present

## 2017-10-01 DIAGNOSIS — D72829 Elevated white blood cell count, unspecified: Secondary | ICD-10-CM | POA: Diagnosis present

## 2017-10-01 DIAGNOSIS — K529 Noninfective gastroenteritis and colitis, unspecified: Secondary | ICD-10-CM | POA: Diagnosis not present

## 2017-10-01 DIAGNOSIS — R1084 Generalized abdominal pain: Secondary | ICD-10-CM | POA: Diagnosis not present

## 2017-10-01 DIAGNOSIS — K56609 Unspecified intestinal obstruction, unspecified as to partial versus complete obstruction: Secondary | ICD-10-CM | POA: Diagnosis present

## 2017-10-01 DIAGNOSIS — Z881 Allergy status to other antibiotic agents status: Secondary | ICD-10-CM | POA: Diagnosis not present

## 2017-10-01 DIAGNOSIS — I10 Essential (primary) hypertension: Secondary | ICD-10-CM | POA: Diagnosis present

## 2017-10-01 DIAGNOSIS — Z86711 Personal history of pulmonary embolism: Secondary | ICD-10-CM | POA: Diagnosis not present

## 2017-10-01 LAB — URINALYSIS, ROUTINE W REFLEX MICROSCOPIC
Bilirubin Urine: NEGATIVE
GLUCOSE, UA: NEGATIVE mg/dL
HGB URINE DIPSTICK: NEGATIVE
Ketones, ur: NEGATIVE mg/dL
Leukocytes, UA: NEGATIVE
Nitrite: NEGATIVE
PROTEIN: NEGATIVE mg/dL
Specific Gravity, Urine: 1.04 — ABNORMAL HIGH (ref 1.005–1.030)
pH: 5 (ref 5.0–8.0)

## 2017-10-01 LAB — GLUCOSE, CAPILLARY
GLUCOSE-CAPILLARY: 104 mg/dL — AB (ref 65–99)
Glucose-Capillary: 114 mg/dL — ABNORMAL HIGH (ref 65–99)
Glucose-Capillary: 118 mg/dL — ABNORMAL HIGH (ref 65–99)
Glucose-Capillary: 123 mg/dL — ABNORMAL HIGH (ref 65–99)

## 2017-10-01 LAB — TROPONIN I: Troponin I: <0.03 ng/mL (ref <0.03)

## 2017-10-01 LAB — BASIC METABOLIC PANEL
Anion gap: 6 (ref 5–15)
BUN: 11 mg/dL (ref 6–20)
CO2: 26 mmol/L (ref 22–32)
Calcium: 8.3 mg/dL — ABNORMAL LOW (ref 8.9–10.3)
Chloride: 107 mmol/L (ref 101–111)
Creatinine, Ser: 0.73 mg/dL (ref 0.44–1.00)
GFR calc Af Amer: >60 mL/min (ref >60)
GFR calc non Af Amer: >60 mL/min (ref >60)
Glucose, Bld: 125 mg/dL — ABNORMAL HIGH (ref 65–99)
Potassium: 4.3 mmol/L (ref 3.5–5.1)
Sodium: 139 mmol/L (ref 135–145)

## 2017-10-01 LAB — CBC
HEMATOCRIT: 38.7 % (ref 36.0–46.0)
HEMOGLOBIN: 12.7 g/dL (ref 12.0–15.0)
MCH: 28.9 pg (ref 26.0–34.0)
MCHC: 32.8 g/dL (ref 30.0–36.0)
MCV: 88.2 fL (ref 78.0–100.0)
Platelets: 287 10*3/uL (ref 150–400)
RBC: 4.39 MIL/uL (ref 3.87–5.11)
RDW: 13.4 % (ref 11.5–15.5)
WBC: 10.1 10*3/uL (ref 4.0–10.5)

## 2017-10-01 MED ORDER — ONDANSETRON HCL 4 MG PO TABS: 4.0000 mg | ORAL_TABLET | Freq: Four times a day (QID) | ORAL | Status: DC | PRN

## 2017-10-01 MED ORDER — METOCLOPRAMIDE HCL 5 MG/ML IJ SOLN
5.0000 mg | Freq: Once | INTRAMUSCULAR | Status: AC
  Administered 2017-10-01: 5 mg via INTRAVENOUS
  Filled 2017-10-01: qty 2

## 2017-10-01 MED ORDER — CIPROFLOXACIN IN D5W 400 MG/200ML IV SOLN
400.0000 mg | Freq: Once | INTRAVENOUS | Status: AC
  Administered 2017-10-01: 400 mg via INTRAVENOUS
  Filled 2017-10-01: qty 200

## 2017-10-01 MED ORDER — LIDOCAINE HCL 2 % EX GEL
1.0000 "application " | Freq: Once | CUTANEOUS | Status: AC
  Administered 2017-10-01: 1
  Filled 2017-10-01: qty 5

## 2017-10-01 MED ORDER — KETOROLAC TROMETHAMINE 15 MG/ML IJ SOLN
15.0000 mg | Freq: Three times a day (TID) | INTRAMUSCULAR | Status: DC | PRN
  Administered 2017-10-01: 15 mg via INTRAVENOUS
  Filled 2017-10-01: qty 1

## 2017-10-01 MED ORDER — METRONIDAZOLE IN NACL 5-0.79 MG/ML-% IV SOLN
500.0000 mg | Freq: Once | INTRAVENOUS | Status: AC
  Administered 2017-10-01: 500 mg via INTRAVENOUS
  Filled 2017-10-01: qty 100

## 2017-10-01 MED ORDER — ACETAMINOPHEN 325 MG PO TABS: 650.0000 mg | ORAL_TABLET | Freq: Four times a day (QID) | ORAL | Status: DC | PRN

## 2017-10-01 MED ORDER — ACETAMINOPHEN 650 MG RE SUPP
650.0000 mg | Freq: Four times a day (QID) | RECTAL | Status: DC | PRN
  Administered 2017-10-01: 650 mg via RECTAL
  Filled 2017-10-01: qty 1

## 2017-10-01 MED ORDER — METRONIDAZOLE IN NACL 5-0.79 MG/ML-% IV SOLN
500.0000 mg | Freq: Three times a day (TID) | INTRAVENOUS | Status: DC
  Administered 2017-10-01 – 2017-10-02 (×4): 500 mg via INTRAVENOUS
  Filled 2017-10-01 (×5): qty 100

## 2017-10-01 MED ORDER — SODIUM CHLORIDE 0.9 % IV BOLUS (SEPSIS)
1000.0000 mL | Freq: Once | INTRAVENOUS | Status: AC
  Administered 2017-10-01: 1000 mL via INTRAVENOUS

## 2017-10-01 MED ORDER — LORAZEPAM 2 MG/ML IJ SOLN
1.0000 mg | Freq: Four times a day (QID) | INTRAMUSCULAR | Status: DC | PRN
  Administered 2017-10-02 (×2): 1 mg via INTRAVENOUS
  Filled 2017-10-01 (×2): qty 1

## 2017-10-01 MED ORDER — PHENOL 1.4 % MT LIQD
1.0000 | OROMUCOSAL | Status: DC | PRN
  Administered 2017-10-01: 1 via OROMUCOSAL
  Filled 2017-10-01: qty 177

## 2017-10-01 MED ORDER — SODIUM CHLORIDE 0.9 % IV SOLN
INTRAVENOUS | Status: DC
  Administered 2017-10-01: 01:00:00 via INTRAVENOUS

## 2017-10-01 MED ORDER — DEXTROSE-NACL 5-0.9 % IV SOLN
INTRAVENOUS | Status: DC
  Administered 2017-10-01: 75 mL/h via INTRAVENOUS
  Administered 2017-10-01: 06:00:00 via INTRAVENOUS

## 2017-10-01 MED ORDER — ONDANSETRON HCL 4 MG/2ML IJ SOLN
4.0000 mg | INTRAMUSCULAR | Status: DC | PRN
  Administered 2017-10-01 – 2017-10-02 (×2): 4 mg via INTRAVENOUS
  Filled 2017-10-01 (×2): qty 2

## 2017-10-01 MED ORDER — ONDANSETRON HCL 4 MG/2ML IJ SOLN
4.0000 mg | Freq: Four times a day (QID) | INTRAMUSCULAR | Status: DC | PRN
  Administered 2017-10-01: 4 mg via INTRAVENOUS
  Filled 2017-10-01: qty 2

## 2017-10-01 MED ORDER — DIATRIZOATE MEGLUMINE & SODIUM 66-10 % PO SOLN
90.0000 mL | Freq: Once | ORAL | Status: AC
  Administered 2017-10-01: 90 mL via NASOGASTRIC
  Filled 2017-10-01: qty 90

## 2017-10-01 MED ORDER — CIPROFLOXACIN IN D5W 400 MG/200ML IV SOLN
400.0000 mg | Freq: Two times a day (BID) | INTRAVENOUS | Status: DC
  Administered 2017-10-01 – 2017-10-02 (×3): 400 mg via INTRAVENOUS
  Filled 2017-10-01 (×3): qty 200

## 2017-10-01 NOTE — ED Notes (Signed)
ED TO INPATIENT HANDOFF REPORT  Name/Age/Gender Tonya Stanley 72 y.o. female  Code Status Code Status History    This patient does not have a recorded code status. Please follow your organizational policy for patients in this situation.      Home/SNF/Other Home  Chief Complaint abdominal pain  Level of Care/Admitting Diagnosis ED Disposition    ED Disposition Condition Comment   Admit  Hospital Area: [place] [[id number]]  Level of Care: Med-Surg [16]  Diagnosis: SBO (small bowel obstruction) [facility]) [[id number]]  Admitting Physician: Rise Patience [phone]  Attending Physician: Rise Patience [phone]  Estimated length of stay: past midnight tomorrow  Certification:: I certify this patient will need inpatient services for at least 2 midnights  PT Class (Do Not Modify): Inpatient [101]  PT Acc Code (Do Not Modify): Private [1]       Medical History Past Medical History:  Diagnosis Date  . Liver lesion   . Paroxysmal atrial fibrillation ([place])    RFAblation 4/14 [facility]  . Pulmonary embolism ([place])    post RFCA atrial fib  [facility] 4/14    Allergies Allergies  Allergen Reactions  . Erythromycin Nausea And Vomiting  . Macrobid [Nitrofurantoin Macrocrystal] Nausea And Vomiting    IV Location/Drains/Wounds Patient Lines/Drains/Airways Status   Active Line/Drains/Airways    Name:   Placement date:   Placement time:   Site:   Days:   Peripheral IV 09/30/17 Left Forearm   09/30/17    -    Forearm   1   NG/OG Tube Nasogastric 16 Fr. Right nare Aucultation Measured external length of tube 22 cm   10/01/17    0057    Right nare   less than 1          Labs/Imaging Results for orders placed or performed during the hospital encounter of 09/30/17 (from the past 48 hour(s))  Lipase, blood     Status: None   Collection Time: 09/30/17 10:47 PM  Result Value Ref Range   Lipase 25 11 - 51 U/L    Comment: Performed at [facility], [place]  [address], [place], [place] [address]  Comprehensive metabolic panel     Status: Abnormal   Collection Time: 09/30/17 10:47 PM  Result Value Ref Range   Sodium 139 135 - 145 mmol/L   Potassium 4.0 3.5 - 5.1 mmol/L   Chloride 104 101 - 111 mmol/L   CO2 22 22 - 32 mmol/L   Glucose, Bld 196 (H) 65 - 99 mg/dL   BUN 15 6 - 20 mg/dL   Creatinine, Ser 0.79 0.44 - 1.00 mg/dL   Calcium 9.6 8.9 - 10.3 mg/dL   Total Protein 7.2 6.5 - 8.1 g/dL   Albumin 4.2 3.5 - 5.0 g/dL   AST 23 15 - 41 U/L   ALT 20 14 - 54 U/L   Alkaline Phosphatase 80 38 - 126 U/L   Total Bilirubin 0.7 0.3 - 1.2 mg/dL   GFR calc non Af Amer >60 >60 mL/min   GFR calc Af Amer >60 >60 mL/min    Comment: (NOTE) The eGFR has been calculated using the CKD EPI equation. This calculation has not been validated in all clinical situations. eGFR's persistently <60 mL/min signify possible Chronic Kidney Disease.    Anion gap 13 5 - 15    Comment: Performed at [facility], [place] [address] [place], [place] [address]  CBC     Status: Abnormal  Collection Time: 09/30/17 10:47 PM  Result Value Ref Range   WBC 16.4 (H) 4.0 - 10.5 K/uL   RBC 5.18 (H) 3.87 - 5.11 MIL/uL   Hemoglobin 15.0 12.0 - 15.0 g/dL   HCT 45.1 36.0 - 46.0 %   MCV 87.1 78.0 - 100.0 fL   MCH 29.0 26.0 - 34.0 pg   MCHC 33.3 30.0 - 36.0 g/dL   RDW 13.3 11.5 - 15.5 %   Platelets 305 150 - 400 K/uL    Comment: Performed at [facility], [place] [address], [place], [place] [address]  Troponin I     Status: None   Collection Time: 09/30/17 11:08 PM  Result Value Ref Range   Troponin I <0.03 <0.03 ng/mL    Comment: Performed at [facility], [place] [address] [place], [place] [address]   Dg Abdomen 1 View  Result Date: 10/01/2017 CLINICAL DATA:  Initial evaluation for enteric tube placement. EXAM: ABDOMEN - 1 VIEW COMPARISON:  Prior CT from 09/30/2017. FINDINGS: Enteric tube in place with tip in side hole overlying  the stomach, side hole just beyond the GE junction. Visualized bowel gas pattern within normal limits. IV contrast material seen within the renal collecting systems. Visualized lungs are clear. IMPRESSION: Enteric tube in place within the stomach, side hole just beyond the GE junction. Electronically Signed   By: Jeannine Boga M.D.   On: 10/01/2017 01:44   Ct Abdomen Pelvis W Contrast  Result Date: 09/30/2017 CLINICAL DATA:  Acute onset of generalized abdominal pain, vomiting and diarrhea. EXAM: CT ABDOMEN AND PELVIS WITH CONTRAST TECHNIQUE: Multidetector CT imaging of the abdomen and pelvis was performed using the standard protocol following bolus administration of intravenous contrast. CONTRAST:  11m ISOVUE-300 IOPAMIDOL (ISOVUE-300) INJECTION 61% COMPARISON:  Abdominal ultrasound performed 03/30/2013 FINDINGS: Lower chest: The visualized lung bases are grossly clear. The visualized portions of the mediastinum are unremarkable. Hepatobiliary: Scattered hepatic cysts measure up to 4.9 cm in size. The liver is otherwise unremarkable. Trace ascites is noted about the liver. The gallbladder is unremarkable in appearance. The common bile duct remains normal in caliber. Pancreas: The pancreas is within normal limits. Spleen: The spleen is unremarkable in appearance. Adrenals/Urinary Tract: The adrenal glands are unremarkable in appearance. The kidneys are within normal limits. There is no evidence of hydronephrosis. No renal or ureteral stones are identified. No perinephric stranding is seen. Stomach/Bowel: The stomach is unremarkable in appearance. There is mild diffuse distention of small-bowel loops to 3.2 cm in maximal diameter. Several foci of narrowing are noted along the distal ileum, without a well-defined transition point. This may reflect partial small bowel obstruction, or possibly infectious or inflammatory ileitis with underlying dysmotility. A small amount of free fluid is seen tracking about  small bowel loops. The appendix is not definitely characterized; the colon is largely decompressed and unremarkable in appearance. Vascular/Lymphatic: Scattered calcification is seen along the abdominal aorta and its branches. The abdominal aorta is otherwise grossly unremarkable. The inferior vena cava is grossly unremarkable. No retroperitoneal lymphadenopathy is seen. No pelvic sidewall lymphadenopathy is identified. Reproductive: The bladder is mildly distended and unremarkable in appearance. The uterus is grossly unremarkable. The ovaries are relatively symmetric. No suspicious adnexal masses are seen. Other: No additional soft tissue abnormalities are seen. Musculoskeletal: No acute osseous abnormalities are identified. Endplate sclerotic change is noted at L4-L5. The visualized musculature is unremarkable in appearance. IMPRESSION: 1. Mild diffuse distention of small-bowel loops to 3.2 cm in maximal diameter.  Several foci of narrowing noted along the distal ileum, without a well-defined transition point. This may reflect partial small-bowel obstruction, or possibly infectious or inflammatory ileitis with underlying dysmotility. Small amount of free fluid noted tracking about small bowel loops. 2. Scattered hepatic cysts noted. 3. Trace ascites noted about the liver, likely arising from the small bowel process. Aortic Atherosclerosis (ICD10-I70.0). Electronically Signed   By: Garald Balding M.D.   On: 09/30/2017 23:58    Pending Labs Unresulted Labs (From admission, onward)   Start     Ordered   09/30/17 2228  Urinalysis, Routine w reflex microscopic  STAT,   STAT     09/30/17 2228      Vitals/Pain Today's Vitals   09/30/17 2225 09/30/17 2238 09/30/17 2250 10/01/17 0107  BP:  (!) 161/91  (!) 141/76  Pulse:  90  97  Resp:  20  20  Temp:  97.9 F (36.6 C)    TempSrc:  Oral    SpO2:  98% 96% 97%  Weight:  180 lb (81.6 kg)    PainSc: 10-Worst pain ever  8      Isolation Precautions No  active isolations  Medications Medications  sodium chloride 0.9 % injection (not administered)  ciprofloxacin (CIPRO) IVPB 400 mg (400 mg Intravenous New Bag/Given 10/01/17 0138)  0.9 %  sodium chloride infusion ( Intravenous New Bag/Given 10/01/17 0039)  morphine 4 MG/ML injection 4 mg (4 mg Intravenous Given 10/01/17 0023)  metoCLOPramide (REGLAN) injection 5 mg (5 mg Intravenous Given 10/01/17 0021)  diphenhydrAMINE (BENADRYL) injection 12.5 mg (12.5 mg Intravenous Given 10/01/17 0022)  iopamidol (ISOVUE-300) 61 % injection (100 mLs  Contrast Given 09/30/17 2331)  metroNIDAZOLE (FLAGYL) IVPB 500 mg (0 mg Intravenous Stopped 10/01/17 0138)  sodium chloride 0.9 % bolus 1,000 mL (0 mLs Intravenous Stopped 10/01/17 0138)  lidocaine (XYLOCAINE) 2 % jelly 1 application (1 application Other Given 10/01/17 0053)    Mobility walks

## 2017-10-01 NOTE — H&P (Signed)
History and Physical    Tonya Stanley ZOX:[id number] DOB: February 25, 1946 DOA: 09/30/2017  PCP: Kathyrn Lass, MD  Patient coming from: Home.  Chief Complaint: Abdominal pain nausea vomiting.  HPI: Tonya Stanley is a 72 y.o. female with history of paroxysmal atrial fibrillation status post ablation, previous history of pulmonary embolism presents to the ER with complaints of abdominal pain.  Patient states since yesterday morning patient has been having frequent bowel movement followed by later in the evening with severe abdominal pain and multiple episodes of nausea vomiting.  Had no further bowel movement of passing flatus following that.  Patient actually had followed up with her gastroenterologist Dr. Thana Farr at [facility].  Patient states she had a normal colonoscopy 6 months ago which showed a stricture followed by CT scan done in December 2018 which was not showing anything acute.  Over the last 1 year patient has been having low volume stools.  ED Course: In the ER CAT scan shows dilated small bowels with multiple focus of narrowing in the terminal ileum with no definite transition point.  Differentials includes partial small bowel obstruction with possible inflammatory versus infectious enteritis.  Patient was placed on NG tube and empiric antibiotic and admitted for further management.  At the time of my exam patient abdominal pain has subsided.  Review of Systems: As per HPI, rest all negative.   Past Medical History:  Diagnosis Date  . Liver lesion   . Paroxysmal atrial fibrillation ([place])    RFAblation 4/14 [facility]  . Pulmonary embolism ([place])    post RFCA atrial fib  [facility] 4/14    Past Surgical History:  Procedure Laterality Date  . ABLATION OF DYSRHYTHMIC FOCUS  11/13/2012  . COLONOSCOPY    . TONSILLECTOMY       reports that  has never smoked. she has never used smokeless tobacco. She reports that she does not drink alcohol or use drugs.  Allergies  Allergen Reactions  .  Erythromycin Nausea And Vomiting  . Macrobid [Nitrofurantoin Macrocrystal] Nausea And Vomiting    Family History  Problem Relation Age of Onset  . Hypertension Other     Prior to Admission medications   Medication Sig Start Date End Date Taking? Authorizing Provider  Biotin 1000 MCG tablet Take 1,000 mcg by mouth daily.   Yes [provider]  Cholecalciferol (VITAMIN D3) 1000 units CAPS Take 4,000 Units by mouth daily.   Yes [provider]  MAGNESIUM PO Take 1 capsule by mouth daily.   Yes [provider]  Omega-3 Fatty Acids (FISH OIL) 1000 MG CAPS Take 1,000 mg by mouth daily.   Yes [provider]    Physical Exam: Vitals:   09/30/17 2250 10/01/17 0107 10/01/17 0240 10/01/17 0241  BP:  (!) 141/76  (!) 144/83  Pulse:  97  96  Resp:  20  18  Temp:    98.8 F (37.1 C)  TempSrc:    Oral  SpO2: 96% 97%  94%  Weight:   81.6 kg (180 lb)       Constitutional: Moderately built and nourished. Vitals:   09/30/17 2250 10/01/17 0107 10/01/17 0240 10/01/17 0241  BP:  (!) 141/76  (!) 144/83  Pulse:  97  96  Resp:  20  18  Temp:    98.8 F (37.1 C)  TempSrc:    Oral  SpO2: 96% 97%  94%  Weight:   81.6 kg (180 lb)    Eyes: Anicteric no  pallor. ENMT: No discharge from the ears eyes nose or mouth. Neck: No mass palpated no JVD appreciated. Respiratory: No rhonchi or crepitations. Cardiovascular: S1-S2 heard no murmurs appreciated. Abdomen: Soft nontender bowel sounds not appreciated. Musculoskeletal: No edema. Skin: No rash. Neurologic: Alert awake oriented to time place and person.  Moves all extremities. Psychiatric: Appears normal.  Normal affect.   Labs on Admission: I have personally reviewed following labs and imaging studies  CBC: Recent Labs  Lab 09/30/17 2247  WBC 16.4*  HGB 15.0  HCT 45.1  MCV 87.1  PLT 622   Basic Metabolic Panel: Recent Labs  Lab 09/30/17 2247  NA 139  K 4.0  CL 104  CO2 22  GLUCOSE 196*    BUN 15  CREATININE 0.79  CALCIUM 9.6   GFR: CrCl cannot be calculated (Unknown ideal weight.). Liver Function Tests: Recent Labs  Lab 09/30/17 2247  AST 23  ALT 20  ALKPHOS 80  BILITOT 0.7  PROT 7.2  ALBUMIN 4.2   Recent Labs  Lab 09/30/17 2247  LIPASE 25   No results for input(s): AMMONIA in the last 168 hours. Coagulation Profile: No results for input(s): INR, PROTIME in the last 168 hours. Cardiac Enzymes: Recent Labs  Lab 09/30/17 2308  TROPONINI <0.03   BNP (last 3 results) No results for input(s): PROBNP in the last 8760 hours. HbA1C: No results for input(s): HGBA1C in the last 72 hours. CBG: No results for input(s): GLUCAP in the last 168 hours. Lipid Profile: No results for input(s): CHOL, HDL, LDLCALC, TRIG, CHOLHDL, LDLDIRECT in the last 72 hours. Thyroid Function Tests: No results for input(s): TSH, T4TOTAL, FREET4, T3FREE, THYROIDAB in the last 72 hours. Anemia Panel: No results for input(s): VITAMINB12, FOLATE, FERRITIN, TIBC, IRON, RETICCTPCT in the last 72 hours. Urine analysis:    Component Value Date/Time   COLORURINE YELLOW 11/17/2012 1147   APPEARANCEUR CLEAR 11/17/2012 1147   LABSPEC 1.012 11/17/2012 1147   PHURINE 7.0 11/17/2012 1147   GLUCOSEU NEGATIVE 11/17/2012 1147   HGBUR NEGATIVE 11/17/2012 1147   [place] 11/17/2012 1147   KETONESUR NEGATIVE 11/17/2012 1147   PROTEINUR NEGATIVE 11/17/2012 1147   UROBILINOGEN 1.0 11/17/2012 1147   NITRITE NEGATIVE 11/17/2012 1147   LEUKOCYTESUR NEGATIVE 11/17/2012 1147   Sepsis Labs: @LABRCNTIP (procalcitonin:4,lacticidven:4) )No results found for this or any previous visit (from the past 240 hour(s)).   Radiological Exams on Admission: Dg Abdomen 1 View  Result Date: 10/01/2017 CLINICAL DATA:  Initial evaluation for enteric tube placement. EXAM: ABDOMEN - 1 VIEW COMPARISON:  Prior CT from 09/30/2017. FINDINGS: Enteric tube in place with tip in side hole overlying the stomach,  side hole just beyond the GE junction. Visualized bowel gas pattern within normal limits. IV contrast material seen within the renal collecting systems. Visualized lungs are clear. IMPRESSION: Enteric tube in place within the stomach, side hole just beyond the GE junction. Electronically Signed   By: Jeannine Boga M.D.   On: 10/01/2017 01:44   Ct Abdomen Pelvis W Contrast  Result Date: 09/30/2017 CLINICAL DATA:  Acute onset of generalized abdominal pain, vomiting and diarrhea. EXAM: CT ABDOMEN AND PELVIS WITH CONTRAST TECHNIQUE: Multidetector CT imaging of the abdomen and pelvis was performed using the standard protocol following bolus administration of intravenous contrast. CONTRAST:  131mL ISOVUE-300 IOPAMIDOL (ISOVUE-300) INJECTION 61% COMPARISON:  Abdominal ultrasound performed 03/30/2013 FINDINGS: Lower chest: The visualized lung bases are grossly clear. The visualized portions of the mediastinum are unremarkable. Hepatobiliary: Scattered hepatic cysts measure up  to 4.9 cm in size. The liver is otherwise unremarkable. Trace ascites is noted about the liver. The gallbladder is unremarkable in appearance. The common bile duct remains normal in caliber. Pancreas: The pancreas is within normal limits. Spleen: The spleen is unremarkable in appearance. Adrenals/Urinary Tract: The adrenal glands are unremarkable in appearance. The kidneys are within normal limits. There is no evidence of hydronephrosis. No renal or ureteral stones are identified. No perinephric stranding is seen. Stomach/Bowel: The stomach is unremarkable in appearance. There is mild diffuse distention of small-bowel loops to 3.2 cm in maximal diameter. Several foci of narrowing are noted along the distal ileum, without a well-defined transition point. This may reflect partial small bowel obstruction, or possibly infectious or inflammatory ileitis with underlying dysmotility. A small amount of free fluid is seen tracking about small bowel  loops. The appendix is not definitely characterized; the colon is largely decompressed and unremarkable in appearance. Vascular/Lymphatic: Scattered calcification is seen along the abdominal aorta and its branches. The abdominal aorta is otherwise grossly unremarkable. The inferior vena cava is grossly unremarkable. No retroperitoneal lymphadenopathy is seen. No pelvic sidewall lymphadenopathy is identified. Reproductive: The bladder is mildly distended and unremarkable in appearance. The uterus is grossly unremarkable. The ovaries are relatively symmetric. No suspicious adnexal masses are seen. Other: No additional soft tissue abnormalities are seen. Musculoskeletal: No acute osseous abnormalities are identified. Endplate sclerotic change is noted at L4-L5. The visualized musculature is unremarkable in appearance. IMPRESSION: 1. Mild diffuse distention of small-bowel loops to 3.2 cm in maximal diameter. Several foci of narrowing noted along the distal ileum, without a well-defined transition point. This may reflect partial small-bowel obstruction, or possibly infectious or inflammatory ileitis with underlying dysmotility. Small amount of free fluid noted tracking about small bowel loops. 2. Scattered hepatic cysts noted. 3. Trace ascites noted about the liver, likely arising from the small bowel process. Aortic Atherosclerosis (ICD10-I70.0). Electronically Signed   By: Garald Balding M.D.   On: 09/30/2017 23:58    EKG: Independently reviewed.  Ectopic atrial rhythm.  Assessment/Plan Principal Problem:   Partial small bowel obstruction (HCC) Active Problems:   SBO (small bowel obstruction) ([place])    1. Possible partial small obstruction -differentials include inflammatory versus infectious enteritis.  Patient is on Cipro and Flagyl NG tube suction will be continued for now.  Will consult general surgery for further recommendations.  KUB in a.m.  Pain relief medications hydration. 2. Elevated blood  pressure -closely follow blood pressure trends. 3. History approximately fibrillation status post ablation presently EKG showing ectopic atrial rhythm. 4. History of previous PE. 5. Leukocytosis -will be reactionary.  CAT scan shows possible enteritis which patient is on Cipro and Flagyl.  UA is pending.   DVT prophylaxis: SCDs. Code Status: Full code. Family Communication: Discussed with patient. Disposition Plan: Home. Consults called: Will consult general surgery. Admission status: Inpatient.   Rise Patience MD [facility] Pager [phone].  If 7PM-7AM, please contact night-coverage [email or web address] Password [email or web address]  10/01/2017, 4:16 AM

## 2017-10-01 NOTE — Progress Notes (Signed)
Dr. Kakrakandy at bedside at this time 

## 2017-10-01 NOTE — Progress Notes (Signed)
Patient seen and evaluated, chart reviewed, please see EMR for updated orders. Please see full H&P dictated by admitting physician Dr. Renetta Chalk for same date of service.     SBO Vs gastroenteritis- d/w Dr Marlou Starks (gen surgery)----check small bowel protocol, if contrast material makes it down to the colon,  Then consider DC NG and start clear liquids , prn antiemetics and pain medications as ordered   Patient seen and evaluated, chart reviewed, please see EMR for updated orders. Please see full H&P dictated by admitting physician Dr. Renetta Chalk for same date of service.

## 2017-10-01 NOTE — Consult Note (Signed)
Reason for Consult:abdominal pain Referring Physician: Dr. Theodis Shove is an 72 y.o. female.  HPI: The patient is a 72 year old white female who presents with abdominal pain that started yesterday after eating a seafood salad. She didn't develop vomiting until last night. She denies fever or chill. She came to ER where a CT showed possible sbo vs enteritis. She has ng. At the time of interview she feels better and her belly is softer  Past Medical History:  Diagnosis Date  . Liver lesion   . Paroxysmal atrial fibrillation ([place])    RFAblation 4/14 [facility]  . Pulmonary embolism ([place])    post RFCA atrial fib  [facility] 4/14    Past Surgical History:  Procedure Laterality Date  . ABLATION OF DYSRHYTHMIC FOCUS  11/13/2012  . COLONOSCOPY    . TONSILLECTOMY      Family History  Problem Relation Age of Onset  . Hypertension Other     Social History:  reports that  has never smoked. she has never used smokeless tobacco. She reports that she does not drink alcohol or use drugs.  Allergies:  Allergies  Allergen Reactions  . Erythromycin Nausea And Vomiting  . Macrobid [Nitrofurantoin Macrocrystal] Nausea And Vomiting    Medications: I have reviewed the patient's current medications.  Results for orders placed or performed during the hospital encounter of 09/30/17 (from the past 48 hour(s))  Lipase, blood     Status: None   Collection Time: 09/30/17 10:47 PM  Result Value Ref Range   Lipase 25 11 - 51 U/L    Comment: Performed at [facility], [place] [address], [place], [place] [address]  Comprehensive metabolic panel     Status: Abnormal   Collection Time: 09/30/17 10:47 PM  Result Value Ref Range   Sodium 139 135 - 145 mmol/L   Potassium 4.0 3.5 - 5.1 mmol/L   Chloride 104 101 - 111 mmol/L   CO2 22 22 - 32 mmol/L   Glucose, Bld 196 (H) 65 - 99 mg/dL   BUN 15 6 - 20 mg/dL   Creatinine, Ser 0.79 0.44 - 1.00 mg/dL   Calcium 9.6 8.9 - 10.3 mg/dL   Total  Protein 7.2 6.5 - 8.1 g/dL   Albumin 4.2 3.5 - 5.0 g/dL   AST 23 15 - 41 U/L   ALT 20 14 - 54 U/L   Alkaline Phosphatase 80 38 - 126 U/L   Total Bilirubin 0.7 0.3 - 1.2 mg/dL   GFR calc non Af Amer >60 >60 mL/min   GFR calc Af Amer >60 >60 mL/min    Comment: (NOTE) The eGFR has been calculated using the CKD EPI equation. This calculation has not been validated in all clinical situations. eGFR's persistently <60 mL/min signify possible Chronic Kidney Disease.    Anion gap 13 5 - 15    Comment: Performed at [facility], [place] [address] [place], [place] [address]  CBC     Status: Abnormal   Collection Time: 09/30/17 10:47 PM  Result Value Ref Range   WBC 16.4 (H) 4.0 - 10.5 K/uL   RBC 5.18 (H) 3.87 - 5.11 MIL/uL   Hemoglobin 15.0 12.0 - 15.0 g/dL   HCT 45.1 36.0 - 46.0 %   MCV 87.1 78.0 - 100.0 fL   MCH 29.0 26.0 - 34.0 pg   MCHC 33.3 30.0 - 36.0 g/dL   RDW 13.3 11.5 - 15.5 %   Platelets 305 150 -  400 K/uL    Comment: Performed at [facility], [place] [address] [place], [place] [address]  Urinalysis, Routine w reflex microscopic     Status: Abnormal   Collection Time: 09/30/17 10:47 PM  Result Value Ref Range   Color, Urine YELLOW YELLOW   APPearance CLEAR CLEAR   Specific Gravity, Urine 1.040 (H) 1.005 - 1.030   pH 5.0 5.0 - 8.0   Glucose, UA NEGATIVE NEGATIVE mg/dL   Hgb urine dipstick NEGATIVE NEGATIVE   Bilirubin Urine NEGATIVE NEGATIVE   Ketones, ur NEGATIVE NEGATIVE mg/dL   Protein, ur NEGATIVE NEGATIVE mg/dL   Nitrite NEGATIVE NEGATIVE   Leukocytes, UA NEGATIVE NEGATIVE    Comment: Performed at [place] [address] [place], [place] [address]  Troponin I     Status: None   Collection Time: 09/30/17 11:08 PM  Result Value Ref Range   Troponin I <0.03 <0.03 ng/mL    Comment: Performed at [facility], [place] [address], [place], [place] [address]  Basic metabolic panel     Status:  Abnormal   Collection Time: 10/01/17  6:10 AM  Result Value Ref Range   Sodium 139 135 - 145 mmol/L   Potassium 4.3 3.5 - 5.1 mmol/L   Chloride 107 101 - 111 mmol/L   CO2 26 22 - 32 mmol/L   Glucose, Bld 125 (H) 65 - 99 mg/dL   BUN 11 6 - 20 mg/dL   Creatinine, Ser 0.73 0.44 - 1.00 mg/dL   Calcium 8.3 (L) 8.9 - 10.3 mg/dL   GFR calc non Af Amer >60 >60 mL/min   GFR calc Af Amer >60 >60 mL/min    Comment: (NOTE) The eGFR has been calculated using the CKD EPI equation. This calculation has not been validated in all clinical situations. eGFR's persistently <60 mL/min signify possible Chronic Kidney Disease.    Anion gap 6 5 - 15    Comment: Performed at [facility], [place] [address], [place], [place] [address]  CBC     Status: None   Collection Time: 10/01/17  6:10 AM  Result Value Ref Range   WBC 10.1 4.0 - 10.5 K/uL   RBC 4.39 3.87 - 5.11 MIL/uL   Hemoglobin 12.7 12.0 - 15.0 g/dL   HCT 38.7 36.0 - 46.0 %   MCV 88.2 78.0 - 100.0 fL   MCH 28.9 26.0 - 34.0 pg   MCHC 32.8 30.0 - 36.0 g/dL   RDW 13.4 11.5 - 15.5 %   Platelets 287 150 - 400 K/uL    Comment: Performed at [facility], [place] [address], [place], [place] [address]  Glucose, capillary     Status: Abnormal   Collection Time: 10/01/17  7:52 AM  Result Value Ref Range   Glucose-Capillary 114 (H) 65 - 99 mg/dL   Comment 1 Notify RN    Comment 2 Document in Chart   Glucose, capillary     Status: Abnormal   Collection Time: 10/01/17 12:12 PM  Result Value Ref Range   Glucose-Capillary 104 (H) 65 - 99 mg/dL   Comment 1 Notify RN    Comment 2 Document in Chart     Dg Abdomen 1 View  Result Date: 10/01/2017 CLINICAL DATA:  Initial evaluation for enteric tube placement. EXAM: ABDOMEN - 1 VIEW COMPARISON:  Prior CT from 09/30/2017. FINDINGS: Enteric tube in place with tip in side hole overlying the stomach, side hole just beyond the GE  junction. Visualized bowel gas pattern within normal  limits. IV contrast material seen within the renal collecting systems. Visualized lungs are clear. IMPRESSION: Enteric tube in place within the stomach, side hole just beyond the GE junction. Electronically Signed   By: Jeannine Boga M.D.   On: 10/01/2017 01:44   Ct Abdomen Pelvis W Contrast  Result Date: 09/30/2017 CLINICAL DATA:  Acute onset of generalized abdominal pain, vomiting and diarrhea. EXAM: CT ABDOMEN AND PELVIS WITH CONTRAST TECHNIQUE: Multidetector CT imaging of the abdomen and pelvis was performed using the standard protocol following bolus administration of intravenous contrast. CONTRAST:  14m ISOVUE-300 IOPAMIDOL (ISOVUE-300) INJECTION 61% COMPARISON:  Abdominal ultrasound performed 03/30/2013 FINDINGS: Lower chest: The visualized lung bases are grossly clear. The visualized portions of the mediastinum are unremarkable. Hepatobiliary: Scattered hepatic cysts measure up to 4.9 cm in size. The liver is otherwise unremarkable. Trace ascites is noted about the liver. The gallbladder is unremarkable in appearance. The common bile duct remains normal in caliber. Pancreas: The pancreas is within normal limits. Spleen: The spleen is unremarkable in appearance. Adrenals/Urinary Tract: The adrenal glands are unremarkable in appearance. The kidneys are within normal limits. There is no evidence of hydronephrosis. No renal or ureteral stones are identified. No perinephric stranding is seen. Stomach/Bowel: The stomach is unremarkable in appearance. There is mild diffuse distention of small-bowel loops to 3.2 cm in maximal diameter. Several foci of narrowing are noted along the distal ileum, without a well-defined transition point. This may reflect partial small bowel obstruction, or possibly infectious or inflammatory ileitis with underlying dysmotility. A small amount of free fluid is seen tracking about small bowel loops. The appendix is not definitely characterized; the colon is largely  decompressed and unremarkable in appearance. Vascular/Lymphatic: Scattered calcification is seen along the abdominal aorta and its branches. The abdominal aorta is otherwise grossly unremarkable. The inferior vena cava is grossly unremarkable. No retroperitoneal lymphadenopathy is seen. No pelvic sidewall lymphadenopathy is identified. Reproductive: The bladder is mildly distended and unremarkable in appearance. The uterus is grossly unremarkable. The ovaries are relatively symmetric. No suspicious adnexal masses are seen. Other: No additional soft tissue abnormalities are seen. Musculoskeletal: No acute osseous abnormalities are identified. Endplate sclerotic change is noted at L4-L5. The visualized musculature is unremarkable in appearance. IMPRESSION: 1. Mild diffuse distention of small-bowel loops to 3.2 cm in maximal diameter. Several foci of narrowing noted along the distal ileum, without a well-defined transition point. This may reflect partial small-bowel obstruction, or possibly infectious or inflammatory ileitis with underlying dysmotility. Small amount of free fluid noted tracking about small bowel loops. 2. Scattered hepatic cysts noted. 3. Trace ascites noted about the liver, likely arising from the small bowel process. Aortic Atherosclerosis (ICD10-I70.0). Electronically Signed   By: JGarald BaldingM.D.   On: 09/30/2017 23:58    Review of Systems  Constitutional: Negative.   HENT: Negative.   Eyes: Negative.   Respiratory: Negative.   Cardiovascular: Negative.   Gastrointestinal: Positive for abdominal pain, diarrhea, nausea and vomiting.  Genitourinary: Negative.   Musculoskeletal: Negative.   Skin: Negative.   Neurological: Negative.   Endo/Heme/Allergies: Negative.   Psychiatric/Behavioral: Negative.    Blood pressure (!) 145/74, pulse 95, temperature 98.2 F (36.8 C), temperature source Oral, resp. rate 17, height _0  (1.676 m), weight 81.6 kg (180 lb), SpO2 95 %. Physical Exam   Constitutional: She is oriented to person, place, and time. She appears well-developed and well-nourished. No distress.  HENT:  Head: Normocephalic and atraumatic.  Mouth/Throat: No oropharyngeal exudate.  Eyes: Conjunctivae and EOM are normal. Pupils are equal, round, and reactive to light.  Neck: Normal range of motion. Neck supple.  Cardiovascular: Normal rate, regular rhythm and normal heart sounds.  Respiratory: Effort normal and breath sounds normal. No stridor. No respiratory distress.  GI: Soft. She exhibits no distension. There is no tenderness.  Musculoskeletal: Normal range of motion. She exhibits no edema or tenderness.  Neurological: She is alert and oriented to person, place, and time. Coordination normal.  Skin: Skin is warm and dry. No erythema.  Psychiatric: She has a normal mood and affect. Her behavior is normal. Thought content normal.    Assessment/Plan: The patient may have sbo vs enteritis with the latter being more likely since she has no history of abdominal surgery. At this point I would start the small bowel protocol. If the contrast makes it around to the colon and she feels better then would d/c ng and start clears. Will follow  TOTH III,Norm Wray S 10/01/2017, 4:12 PM

## 2017-10-01 NOTE — Progress Notes (Signed)
Pharmacy Antibiotic Note  Tonya Stanley is a 72 y.o. female admitted on 09/30/2017 with intra-abdominal infection.  Pharmacy has been consulted for ciprofloxacin dosing.  Plan: Ciprofloxacin 400 mg IV q12h  Metronidazole 500 mg IV q8h ( MD)  Monitor clinical course, renal function, cultures as available   Dosage will likely remain stable at above dose and need for further dosage adjustment appears unlikely at present.    Will sign off at this time.  Please reconsult if a change in clinical status warrants re-evaluation of dosage.      Weight: 180 lb (81.6 kg)  Temp (24hrs), Avg:98.4 F (36.9 C), Min:97.9 F (36.6 C), Max:98.8 F (37.1 C)  Recent Labs  Lab 09/30/17 2247  WBC 16.4*  CREATININE 0.79    CrCl cannot be calculated (Unknown ideal weight.).    Allergies  Allergen Reactions  . Erythromycin Nausea And Vomiting  . Macrobid [Nitrofurantoin Macrocrystal] Nausea And Vomiting    Antimicrobials this admission: 3/9 metronidazole >>  3/9 ciprofloxacin >>     Thank you for allowing pharmacy to be a part of this patient's care.  Royetta Asal, PharmD, BCPS Pager [phone] 10/01/2017 4:30 AM

## 2017-10-02 ENCOUNTER — Inpatient Hospital Stay (HOSPITAL_COMMUNITY): Payer: Medicare Other

## 2017-10-02 DIAGNOSIS — K529 Noninfective gastroenteritis and colitis, unspecified: Secondary | ICD-10-CM | POA: Diagnosis present

## 2017-10-02 DIAGNOSIS — I10 Essential (primary) hypertension: Secondary | ICD-10-CM | POA: Diagnosis present

## 2017-10-02 LAB — CBC
HEMATOCRIT: 37.2 % (ref 36.0–46.0)
HEMOGLOBIN: 11.7 g/dL — AB (ref 12.0–15.0)
MCH: 28.5 pg (ref 26.0–34.0)
MCHC: 31.5 g/dL (ref 30.0–36.0)
MCV: 90.7 fL (ref 78.0–100.0)
Platelets: 258 10*3/uL (ref 150–400)
RBC: 4.1 MIL/uL (ref 3.87–5.11)
RDW: 13.8 % (ref 11.5–15.5)
WBC: 8.3 10*3/uL (ref 4.0–10.5)

## 2017-10-02 LAB — BASIC METABOLIC PANEL
ANION GAP: 8 (ref 5–15)
BUN: 11 mg/dL (ref 6–20)
CHLORIDE: 108 mmol/L (ref 101–111)
CO2: 27 mmol/L (ref 22–32)
Calcium: 8.5 mg/dL — ABNORMAL LOW (ref 8.9–10.3)
Creatinine, Ser: 0.75 mg/dL (ref 0.44–1.00)
GFR calc non Af Amer: >60 mL/min (ref >60)
Glucose, Bld: 126 mg/dL — ABNORMAL HIGH (ref 65–99)
POTASSIUM: 3.5 mmol/L (ref 3.5–5.1)
Sodium: 143 mmol/L (ref 135–145)

## 2017-10-02 LAB — GLUCOSE, CAPILLARY
GLUCOSE-CAPILLARY: 158 mg/dL — AB (ref 65–99)
Glucose-Capillary: 114 mg/dL — ABNORMAL HIGH (ref 65–99)

## 2017-10-02 MED ORDER — RANITIDINE HCL 150 MG PO TABS: 150.0000 mg | ORAL_TABLET | Freq: Two times a day (BID) | ORAL | 1 refills | Status: DC

## 2017-10-02 MED ORDER — METOPROLOL TARTRATE 25 MG PO TABS: 25.0000 mg | ORAL_TABLET | Freq: Two times a day (BID) | ORAL | 1 refills | Status: DC

## 2017-10-02 MED ORDER — CALCIUM CARBONATE ANTACID 500 MG PO CHEW: 1.0000 | CHEWABLE_TABLET | Freq: Two times a day (BID) | ORAL | 1 refills | Status: DC

## 2017-10-02 MED ORDER — ONDANSETRON HCL 4 MG PO TABS: 4.0000 mg | ORAL_TABLET | Freq: Four times a day (QID) | ORAL | 0 refills | Status: DC | PRN

## 2017-10-02 NOTE — Progress Notes (Signed)
Pt ate and able to tolerate clear liquids and full liquids without the use of antiemtics. Denies GI upset. Denies pain or discomfort.Reviewed AVS with pt. Pt able to verbalize understanding and teach back info. Waiting for ride to arrive. Should be withing the hour

## 2017-10-02 NOTE — Progress Notes (Signed)
Subjective/Chief Complaint: Has some nausea but also has lots of flatus and diarrhea. Contrast is in colon   Objective: Vital signs in last 24 hours: Temp:  [98.2 F (36.8 C)-99.3 F (37.4 C)] 98.6 F (37 C) (03/10 0626) Pulse Rate:  [83-95] 89 (03/10 0626) Resp:  [16-17] 17 (03/10 0626) BP: (145-162)/(74-81) 159/80 (03/10 0626) SpO2:  [93 %-95 %] 93 % (03/10 0626) Last BM Date: 10/01/17  Intake/Output from previous day: 03/09 0701 - 03/10 0700 In: 1890 [P.O.:60; I.V.:1200; NG/GT:30; IV Piggyback:600] Out: 1378 [Urine:650; Emesis/NG output:725; Stool:3] Intake/Output this shift: No intake/output data recorded.  General appearance: alert and cooperative Resp: clear to auscultation bilaterally Cardio: regular rate and rhythm GI: soft, nontender  Lab Results:  Recent Labs    10/01/17 0610 10/02/17 0544  WBC 10.1 8.3  HGB 12.7 11.7*  HCT 38.7 37.2  PLT 287 258   BMET Recent Labs    10/01/17 0610 10/02/17 0544  NA 139 143  K 4.3 3.5  CL 107 108  CO2 26 27  GLUCOSE 125* 126*  BUN 11 11  CREATININE 0.73 0.75  CALCIUM 8.3* 8.5*   PT/INR No results for input(s): LABPROT, INR in the last 72 hours. ABG No results for input(s): PHART, HCO3 in the last 72 hours.  Invalid input(s): PCO2, PO2  Studies/Results: Dg Abdomen 1 View  Result Date: 10/01/2017 CLINICAL DATA:  Initial evaluation for enteric tube placement. EXAM: ABDOMEN - 1 VIEW COMPARISON:  Prior CT from 09/30/2017. FINDINGS: Enteric tube in place with tip in side hole overlying the stomach, side hole just beyond the GE junction. Visualized bowel gas pattern within normal limits. IV contrast material seen within the renal collecting systems. Visualized lungs are clear. IMPRESSION: Enteric tube in place within the stomach, side hole just beyond the GE junction. Electronically Signed   By: Jeannine Boga M.D.   On: 10/01/2017 01:44   Ct Abdomen Pelvis W Contrast  Result Date: 09/30/2017 CLINICAL  DATA:  Acute onset of generalized abdominal pain, vomiting and diarrhea. EXAM: CT ABDOMEN AND PELVIS WITH CONTRAST TECHNIQUE: Multidetector CT imaging of the abdomen and pelvis was performed using the standard protocol following bolus administration of intravenous contrast. CONTRAST:  146mL ISOVUE-300 IOPAMIDOL (ISOVUE-300) INJECTION 61% COMPARISON:  Abdominal ultrasound performed 03/30/2013 FINDINGS: Lower chest: The visualized lung bases are grossly clear. The visualized portions of the mediastinum are unremarkable. Hepatobiliary: Scattered hepatic cysts measure up to 4.9 cm in size. The liver is otherwise unremarkable. Trace ascites is noted about the liver. The gallbladder is unremarkable in appearance. The common bile duct remains normal in caliber. Pancreas: The pancreas is within normal limits. Spleen: The spleen is unremarkable in appearance. Adrenals/Urinary Tract: The adrenal glands are unremarkable in appearance. The kidneys are within normal limits. There is no evidence of hydronephrosis. No renal or ureteral stones are identified. No perinephric stranding is seen. Stomach/Bowel: The stomach is unremarkable in appearance. There is mild diffuse distention of small-bowel loops to 3.2 cm in maximal diameter. Several foci of narrowing are noted along the distal ileum, without a well-defined transition point. This may reflect partial small bowel obstruction, or possibly infectious or inflammatory ileitis with underlying dysmotility. A small amount of free fluid is seen tracking about small bowel loops. The appendix is not definitely characterized; the colon is largely decompressed and unremarkable in appearance. Vascular/Lymphatic: Scattered calcification is seen along the abdominal aorta and its branches. The abdominal aorta is otherwise grossly unremarkable. The inferior vena cava is grossly unremarkable. No  retroperitoneal lymphadenopathy is seen. No pelvic sidewall lymphadenopathy is identified.  Reproductive: The bladder is mildly distended and unremarkable in appearance. The uterus is grossly unremarkable. The ovaries are relatively symmetric. No suspicious adnexal masses are seen. Other: No additional soft tissue abnormalities are seen. Musculoskeletal: No acute osseous abnormalities are identified. Endplate sclerotic change is noted at L4-L5. The visualized musculature is unremarkable in appearance. IMPRESSION: 1. Mild diffuse distention of small-bowel loops to 3.2 cm in maximal diameter. Several foci of narrowing noted along the distal ileum, without a well-defined transition point. This may reflect partial small-bowel obstruction, or possibly infectious or inflammatory ileitis with underlying dysmotility. Small amount of free fluid noted tracking about small bowel loops. 2. Scattered hepatic cysts noted. 3. Trace ascites noted about the liver, likely arising from the small bowel process. Aortic Atherosclerosis (ICD10-I70.0). Electronically Signed   By: Garald Balding M.D.   On: 09/30/2017 23:58   Dg Abd Portable 1v-small Bowel Obstruction Protocol-initial, 8 Hr Delay  Result Date: 10/02/2017 CLINICAL DATA:  Small-bowel obstruction protocol. 8 hour delayed radiograph. EXAM: PORTABLE ABDOMEN - 1 VIEW COMPARISON:  Abdominal radiograph performed 10/01/2017 FINDINGS: There has been interval progression of contrast into the colon, without evidence for small bowel obstruction at this time. The visualized bowel gas pattern is grossly unremarkable. The patient's enteric tube is noted ending overlying the fundus of the stomach. Mild degenerative change is noted along the lower lumbar spine. IMPRESSION: Interval progression of contrast into the colon. No evidence for small bowel obstruction at this time. Electronically Signed   By: Garald Balding M.D.   On: 10/02/2017 01:13    Anti-infectives: Anti-infectives (From admission, onward)   Start     Dose/Rate Route Frequency Ordered Stop   10/01/17 1200   ciprofloxacin (CIPRO) IVPB 400 mg     400 mg 200 mL/hr over 60 Minutes Intravenous Every 12 hours 10/01/17 0433     10/01/17 0800  metroNIDAZOLE (FLAGYL) IVPB 500 mg     500 mg 100 mL/hr over 60 Minutes Intravenous Every 8 hours 10/01/17 0414     10/01/17 0015  metroNIDAZOLE (FLAGYL) IVPB 500 mg     500 mg 100 mL/hr over 60 Minutes Intravenous  Once 10/01/17 0004 10/01/17 0138   10/01/17 0015  ciprofloxacin (CIPRO) IVPB 400 mg     400 mg 200 mL/hr over 60 Minutes Intravenous  Once 10/01/17 0004 10/01/17 0240      Assessment/Plan: s/p * No surgery found * Advance diet. Start clears D/c ng Ambulate sbo vs enteritis seems to be resolving  LOS: 1 day    TOTH III,Dezyrae Kensinger S 10/02/2017

## 2017-10-02 NOTE — Discharge Summary (Signed)
Tonya Stanley, is a 72 y.o. female  DOB 04-Feb-1946  MRN [id number].  Admission date:  09/30/2017  Admitting Physician  Rise Patience, MD  Discharge Date:  10/02/2017   Primary MD  Kathyrn Lass, MD  Recommendations for primary care physician for things to follow:   1)Avoid ibuprofen/Advil/Aleve/Motrin/Goody Powders/Naproxen/BC powders/Meloxicam/Diclofenac/Indomethacin and other Nonsteroidal anti-inflammatory medications as these will make you more likely to bleed and can cause stomach ulcers, can also cause Kidney problems.   2)Take Tums and Ranitidine for heartburn  3) avoid spicy food   Admission Diagnosis  Partial small bowel obstruction ([place]) [K56.600]   Discharge Diagnosis  Partial small bowel obstruction ([place]) [K56.600]    Principal Problem:   Enteritis Active Problems:   H/o Atrial fibrillation (HCC)   Partial small bowel obstruction (HCC)   SBO (small bowel obstruction) (HCC)   HTN (hypertension)      Past Medical History:  Diagnosis Date  . Liver lesion   . Paroxysmal atrial fibrillation ([place])    RFAblation 4/14 [facility]  . Pulmonary embolism ([place])    post RFCA atrial fib  [facility] 4/14    Past Surgical History:  Procedure Laterality Date  . ABLATION OF DYSRHYTHMIC FOCUS  11/13/2012  . COLONOSCOPY    . TONSILLECTOMY       HPI  from the history and physical done on the day of admission:    Patient coming from: Home.  Chief Complaint: Abdominal pain nausea vomiting.  HPI: Tonya Stanley is a 72 y.o. female with history of paroxysmal atrial fibrillation status post ablation, previous history of pulmonary embolism presents to the ER with complaints of abdominal pain.  Patient states since yesterday morning patient has been having frequent bowel movement followed by later in the evening with severe abdominal pain and multiple episodes of nausea vomiting.  Had no further bowel movement  of passing flatus following that.  Patient actually had followed up with her gastroenterologist Dr. Thana Farr at [facility].  Patient states she had a normal colonoscopy 6 months ago which showed a stricture followed by CT scan done in December 2018 which was not showing anything acute.  Over the last 1 year patient has been having low volume stools.  ED Course: In the ER CAT scan shows dilated small bowels with multiple focus of narrowing in the terminal ileum with no definite transition point.  Differentials includes partial small bowel obstruction with possible inflammatory versus infectious enteritis.  Patient was placed on NG tube and empiric antibiotic and admitted for further management.  At the time of my exam patient abdominal pain has subsided.   Hospital Course:    1)Gastroenteritis-clinically suspect viral gastroenteritis, small bowel obstruction less likely, patient without prior abdominal surgery/laparotomy, d/w Dr Marlou Starks (gen surgery)--- small bowel protocol showed contrast material in the colon, NG tube has been removed, patient is tolerating oral intake well, GI soft diet advised, Tums and ranitidine advised, avoid spicy food, follow-up as needed . D/w on-call general surgeon Dr. Leighton Ruff who is okay  with discharge home today at patient's request .  Leukocytosis has resolved,  2)HTN-blood pressure remains elevated, given history of paroxysmal A. fib, even though patient is currently in sinus rhythm, will treat hypertension with 25 mg of metoprolol twice daily,  CV--- patient with prior history of atrial fibrillation and history of PE currently stable on no medications.  Currently sinus   Discharge Condition: stable  Follow UP- prn with Gen surg   Consults obtained - Gen surg  Diet and Activity recommendation:  As advised  Discharge Instructions    Discharge Instructions    Call MD for:  difficulty breathing, headache or visual disturbances   Complete by:  As directed     Call MD for:  persistant dizziness or light-headedness   Complete by:  As directed    Call MD for:  persistant nausea and vomiting   Complete by:  As directed    Call MD for:  severe uncontrolled pain   Complete by:  As directed    Call MD for:  temperature >100.4   Complete by:  As directed    Diet general   Complete by:  As directed    Discharge instructions   Complete by:  As directed    1)Avoid ibuprofen/Advil/Aleve/Motrin/Goody Powders/Naproxen/BC powders/Meloxicam/Diclofenac/Indomethacin and other Nonsteroidal anti-inflammatory medications as these will make you more likely to bleed and can cause stomach ulcers, can also cause Kidney problems.   2)Take Tums and Ranitidine for heartburn  3)Avoid spicy food   Increase activity slowly   Complete by:  As directed       Discharge Medications     Allergies as of 10/02/2017      Reactions   Erythromycin Nausea And Vomiting   Macrobid [nitrofurantoin Macrocrystal] Nausea And Vomiting      Medication List    TAKE these medications   Biotin 1000 MCG tablet Take 1,000 mcg by mouth daily.   calcium carbonate 500 MG chewable tablet Commonly known as:  TUMS Chew 1 tablet (200 mg of elemental calcium total) by mouth 2 (two) times daily.   Fish Oil 1000 MG Caps Take 1,000 mg by mouth daily.   MAGNESIUM PO Take 1 capsule by mouth daily.   metoprolol tartrate 25 MG tablet Commonly known as:  LOPRESSOR Take 1 tablet (25 mg total) by mouth 2 (two) times daily.   ondansetron 4 MG tablet Commonly known as:  ZOFRAN Take 1 tablet (4 mg total) by mouth every 6 (six) hours as needed for nausea.   ranitidine 150 MG tablet Commonly known as:  ZANTAC Take 1 tablet (150 mg total) by mouth 2 (two) times daily.   Vitamin D3 1000 units Caps Take 4,000 Units by mouth daily.      Major procedures and Radiology Reports - PLEASE review detailed and final reports for all details, in brief -   Dg Abdomen 1 View  Result Date:  10/01/2017 CLINICAL DATA:  Initial evaluation for enteric tube placement. EXAM: ABDOMEN - 1 VIEW COMPARISON:  Prior CT from 09/30/2017. FINDINGS: Enteric tube in place with tip in side hole overlying the stomach, side hole just beyond the GE junction. Visualized bowel gas pattern within normal limits. IV contrast material seen within the renal collecting systems. Visualized lungs are clear. IMPRESSION: Enteric tube in place within the stomach, side hole just beyond the GE junction. Electronically Signed   By: Jeannine Boga M.D.   On: 10/01/2017 01:44   Ct Abdomen Pelvis W Contrast  Result Date: 09/30/2017 CLINICAL  DATA:  Acute onset of generalized abdominal pain, vomiting and diarrhea. EXAM: CT ABDOMEN AND PELVIS WITH CONTRAST TECHNIQUE: Multidetector CT imaging of the abdomen and pelvis was performed using the standard protocol following bolus administration of intravenous contrast. CONTRAST:  14mL ISOVUE-300 IOPAMIDOL (ISOVUE-300) INJECTION 61% COMPARISON:  Abdominal ultrasound performed 03/30/2013 FINDINGS: Lower chest: The visualized lung bases are grossly clear. The visualized portions of the mediastinum are unremarkable. Hepatobiliary: Scattered hepatic cysts measure up to 4.9 cm in size. The liver is otherwise unremarkable. Trace ascites is noted about the liver. The gallbladder is unremarkable in appearance. The common bile duct remains normal in caliber. Pancreas: The pancreas is within normal limits. Spleen: The spleen is unremarkable in appearance. Adrenals/Urinary Tract: The adrenal glands are unremarkable in appearance. The kidneys are within normal limits. There is no evidence of hydronephrosis. No renal or ureteral stones are identified. No perinephric stranding is seen. Stomach/Bowel: The stomach is unremarkable in appearance. There is mild diffuse distention of small-bowel loops to 3.2 cm in maximal diameter. Several foci of narrowing are noted along the distal ileum, without a  well-defined transition point. This may reflect partial small bowel obstruction, or possibly infectious or inflammatory ileitis with underlying dysmotility. A small amount of free fluid is seen tracking about small bowel loops. The appendix is not definitely characterized; the colon is largely decompressed and unremarkable in appearance. Vascular/Lymphatic: Scattered calcification is seen along the abdominal aorta and its branches. The abdominal aorta is otherwise grossly unremarkable. The inferior vena cava is grossly unremarkable. No retroperitoneal lymphadenopathy is seen. No pelvic sidewall lymphadenopathy is identified. Reproductive: The bladder is mildly distended and unremarkable in appearance. The uterus is grossly unremarkable. The ovaries are relatively symmetric. No suspicious adnexal masses are seen. Other: No additional soft tissue abnormalities are seen. Musculoskeletal: No acute osseous abnormalities are identified. Endplate sclerotic change is noted at L4-L5. The visualized musculature is unremarkable in appearance. IMPRESSION: 1. Mild diffuse distention of small-bowel loops to 3.2 cm in maximal diameter. Several foci of narrowing noted along the distal ileum, without a well-defined transition point. This may reflect partial small-bowel obstruction, or possibly infectious or inflammatory ileitis with underlying dysmotility. Small amount of free fluid noted tracking about small bowel loops. 2. Scattered hepatic cysts noted. 3. Trace ascites noted about the liver, likely arising from the small bowel process. Aortic Atherosclerosis (ICD10-I70.0). Electronically Signed   By: Garald Balding M.D.   On: 09/30/2017 23:58   Dg Abd Portable 1v-small Bowel Obstruction Protocol-initial, 8 Hr Delay  Result Date: 10/02/2017 CLINICAL DATA:  Small-bowel obstruction protocol. 8 hour delayed radiograph. EXAM: PORTABLE ABDOMEN - 1 VIEW COMPARISON:  Abdominal radiograph performed 10/01/2017 FINDINGS: There has been  interval progression of contrast into the colon, without evidence for small bowel obstruction at this time. The visualized bowel gas pattern is grossly unremarkable. The patient's enteric tube is noted ending overlying the fundus of the stomach. Mild degenerative change is noted along the lower lumbar spine. IMPRESSION: Interval progression of contrast into the colon. No evidence for small bowel obstruction at this time. Electronically Signed   By: Garald Balding M.D.   On: 10/02/2017 01:13   Micro Results   No results found for this or any previous visit (from the past 240 hour(s)).  Today   Subjective    [facility] today has no new concerns, NG tube is out, tolerating full liquids well, asking for diet to be advanced, no abdominal pain, no fevers, no chills, no vomiting, no nausea, continues  to pass gas,, no further diarrhea          Patient has been seen and examined prior to discharge   Objective   Blood pressure (!) 159/80, pulse 89, temperature 98.6 F (37 C), temperature source Oral, resp. rate 17, height 5\' 6"  (1.676 m), weight 81.6 kg (180 lb), SpO2 93 %.   Intake/Output Summary (Last 24 hours) at 10/02/2017 1415 Last data filed at 10/02/2017 0615 Gross per 24 hour  Intake 1140 ml  Output 1078 ml  Net 62 ml    Exam Gen:- Awake  In no apparent distress  HEENT:- [email or web address],   Neck-Supple Neck,No JVD,  Lungs- mostly clear  CV- S1, S2 normal Abd-  +ve B.Sounds, Abd Soft, No tenderness,    Extremity/Skin:- Intact peripheral pulses   Psych-affect is appropriate   Data Review   CBC w Diff:  Lab Results  Component Value Date   WBC 8.3 10/02/2017   HGB 11.7 (L) 10/02/2017   HCT 37.2 10/02/2017   PLT 258 10/02/2017   LYMPHOPCT 14 11/17/2012   MONOPCT 9 11/17/2012   EOSPCT 1 11/17/2012   BASOPCT 0 11/17/2012    CMP:  Lab Results  Component Value Date   NA 143 10/02/2017   K 3.5 10/02/2017   CL 108 10/02/2017   CO2 27 10/02/2017   BUN 11 10/02/2017   CREATININE  0.75 10/02/2017   PROT 7.2 09/30/2017   ALBUMIN 4.2 09/30/2017   BILITOT 0.7 09/30/2017   ALKPHOS 80 09/30/2017   AST 23 09/30/2017   ALT 20 09/30/2017  .   Total Discharge time is about 33 minutes  Roxan Hockey M.D on 10/02/2017 at 2:15 PM  [facility]   Office  [phone]  Voice Recognition Viviann Spare dictation system was used to create this note, attempts have been made to correct errors. Please contact the author with questions and/or clarifications.

## 2017-10-02 NOTE — Discharge Instructions (Signed)
1)Avoid ibuprofen/Advil/Aleve/Motrin/Goody Powders/Naproxen/BC powders/Meloxicam/Diclofenac/Indomethacin and other Nonsteroidal anti-inflammatory medications as these will make you more likely to bleed and can cause stomach ulcers, can also cause Kidney problems.   2)Take Tums and Ranitidine for heartburn  3) avoid spicy food

## 2017-10-02 NOTE — Progress Notes (Signed)
Removed NGT. Pt tolerated well. Will monitor N&V

## 2017-10-11 DIAGNOSIS — R109 Unspecified abdominal pain: Secondary | ICD-10-CM | POA: Diagnosis not present

## 2017-10-11 DIAGNOSIS — K529 Noninfective gastroenteritis and colitis, unspecified: Secondary | ICD-10-CM | POA: Diagnosis not present

## 2017-10-11 DIAGNOSIS — R030 Elevated blood-pressure reading, without diagnosis of hypertension: Secondary | ICD-10-CM | POA: Diagnosis not present

## 2017-11-05 DIAGNOSIS — J209 Acute bronchitis, unspecified: Secondary | ICD-10-CM | POA: Diagnosis not present

## 2017-11-07 DIAGNOSIS — M545 Low back pain: Secondary | ICD-10-CM | POA: Diagnosis not present

## 2017-11-07 DIAGNOSIS — M9903 Segmental and somatic dysfunction of lumbar region: Secondary | ICD-10-CM | POA: Diagnosis not present

## 2017-11-07 DIAGNOSIS — M5432 Sciatica, left side: Secondary | ICD-10-CM | POA: Diagnosis not present

## 2017-11-07 DIAGNOSIS — M9904 Segmental and somatic dysfunction of sacral region: Secondary | ICD-10-CM | POA: Diagnosis not present

## 2017-11-09 DIAGNOSIS — M9903 Segmental and somatic dysfunction of lumbar region: Secondary | ICD-10-CM | POA: Diagnosis not present

## 2017-11-09 DIAGNOSIS — M545 Low back pain: Secondary | ICD-10-CM | POA: Diagnosis not present

## 2017-11-09 DIAGNOSIS — M9904 Segmental and somatic dysfunction of sacral region: Secondary | ICD-10-CM | POA: Diagnosis not present

## 2017-11-09 DIAGNOSIS — M5432 Sciatica, left side: Secondary | ICD-10-CM | POA: Diagnosis not present

## 2018-01-03 DIAGNOSIS — K56699 Other intestinal obstruction unspecified as to partial versus complete obstruction: Secondary | ICD-10-CM | POA: Diagnosis not present

## 2018-01-03 DIAGNOSIS — K5900 Constipation, unspecified: Secondary | ICD-10-CM | POA: Insufficient documentation

## 2018-01-03 DIAGNOSIS — R933 Abnormal findings on diagnostic imaging of other parts of digestive tract: Secondary | ICD-10-CM | POA: Diagnosis not present

## 2018-01-23 HISTORY — PX: DIAGNOSTIC LAPAROSCOPY: SUR761

## 2018-01-24 DIAGNOSIS — R933 Abnormal findings on diagnostic imaging of other parts of digestive tract: Secondary | ICD-10-CM | POA: Diagnosis not present

## 2018-01-24 DIAGNOSIS — K5900 Constipation, unspecified: Secondary | ICD-10-CM | POA: Diagnosis not present

## 2018-01-24 DIAGNOSIS — K56699 Other intestinal obstruction unspecified as to partial versus complete obstruction: Secondary | ICD-10-CM | POA: Diagnosis not present

## 2018-01-25 DIAGNOSIS — C786 Secondary malignant neoplasm of retroperitoneum and peritoneum: Secondary | ICD-10-CM

## 2018-01-25 DIAGNOSIS — C801 Malignant (primary) neoplasm, unspecified: Secondary | ICD-10-CM

## 2018-01-25 HISTORY — DX: Secondary malignant neoplasm of retroperitoneum and peritoneum: C78.6

## 2018-01-30 DIAGNOSIS — K56699 Other intestinal obstruction unspecified as to partial versus complete obstruction: Secondary | ICD-10-CM | POA: Diagnosis not present

## 2018-01-30 DIAGNOSIS — C187 Malignant neoplasm of sigmoid colon: Secondary | ICD-10-CM | POA: Diagnosis not present

## 2018-01-30 DIAGNOSIS — R1084 Generalized abdominal pain: Secondary | ICD-10-CM | POA: Diagnosis not present

## 2018-01-30 DIAGNOSIS — Z79899 Other long term (current) drug therapy: Secondary | ICD-10-CM | POA: Diagnosis not present

## 2018-02-03 DIAGNOSIS — R933 Abnormal findings on diagnostic imaging of other parts of digestive tract: Secondary | ICD-10-CM | POA: Diagnosis not present

## 2018-02-03 DIAGNOSIS — C187 Malignant neoplasm of sigmoid colon: Secondary | ICD-10-CM | POA: Diagnosis not present

## 2018-02-03 DIAGNOSIS — Z01818 Encounter for other preprocedural examination: Secondary | ICD-10-CM | POA: Diagnosis not present

## 2018-02-03 DIAGNOSIS — Z87891 Personal history of nicotine dependence: Secondary | ICD-10-CM | POA: Diagnosis not present

## 2018-02-06 DIAGNOSIS — C801 Malignant (primary) neoplasm, unspecified: Secondary | ICD-10-CM | POA: Diagnosis not present

## 2018-02-06 DIAGNOSIS — C189 Malignant neoplasm of colon, unspecified: Secondary | ICD-10-CM | POA: Diagnosis not present

## 2018-02-06 DIAGNOSIS — R933 Abnormal findings on diagnostic imaging of other parts of digestive tract: Secondary | ICD-10-CM | POA: Diagnosis not present

## 2018-02-06 DIAGNOSIS — R918 Other nonspecific abnormal finding of lung field: Secondary | ICD-10-CM | POA: Diagnosis not present

## 2018-02-08 DIAGNOSIS — I4891 Unspecified atrial fibrillation: Secondary | ICD-10-CM | POA: Diagnosis present

## 2018-02-08 DIAGNOSIS — E042 Nontoxic multinodular goiter: Secondary | ICD-10-CM | POA: Diagnosis present

## 2018-02-08 DIAGNOSIS — C792 Secondary malignant neoplasm of skin: Secondary | ICD-10-CM | POA: Diagnosis not present

## 2018-02-08 DIAGNOSIS — C187 Malignant neoplasm of sigmoid colon: Secondary | ICD-10-CM | POA: Diagnosis present

## 2018-02-08 DIAGNOSIS — C7989 Secondary malignant neoplasm of other specified sites: Secondary | ICD-10-CM | POA: Diagnosis not present

## 2018-02-08 DIAGNOSIS — Z87891 Personal history of nicotine dependence: Secondary | ICD-10-CM | POA: Diagnosis not present

## 2018-02-08 DIAGNOSIS — C786 Secondary malignant neoplasm of retroperitoneum and peritoneum: Secondary | ICD-10-CM | POA: Diagnosis present

## 2018-02-08 DIAGNOSIS — R188 Other ascites: Secondary | ICD-10-CM | POA: Diagnosis present

## 2018-02-08 DIAGNOSIS — Z86711 Personal history of pulmonary embolism: Secondary | ICD-10-CM | POA: Diagnosis not present

## 2018-02-08 DIAGNOSIS — E559 Vitamin D deficiency, unspecified: Secondary | ICD-10-CM | POA: Diagnosis present

## 2018-02-10 DIAGNOSIS — C189 Malignant neoplasm of colon, unspecified: Secondary | ICD-10-CM | POA: Diagnosis present

## 2018-02-17 DIAGNOSIS — C786 Secondary malignant neoplasm of retroperitoneum and peritoneum: Secondary | ICD-10-CM | POA: Diagnosis not present

## 2018-02-17 DIAGNOSIS — J90 Pleural effusion, not elsewhere classified: Secondary | ICD-10-CM | POA: Diagnosis not present

## 2018-02-17 DIAGNOSIS — C189 Malignant neoplasm of colon, unspecified: Secondary | ICD-10-CM | POA: Diagnosis not present

## 2018-02-17 DIAGNOSIS — Z87891 Personal history of nicotine dependence: Secondary | ICD-10-CM | POA: Diagnosis not present

## 2018-02-17 DIAGNOSIS — Z86711 Personal history of pulmonary embolism: Secondary | ICD-10-CM | POA: Diagnosis not present

## 2018-02-17 DIAGNOSIS — Z9049 Acquired absence of other specified parts of digestive tract: Secondary | ICD-10-CM | POA: Diagnosis not present

## 2018-02-20 DIAGNOSIS — C189 Malignant neoplasm of colon, unspecified: Secondary | ICD-10-CM | POA: Diagnosis not present

## 2018-02-20 DIAGNOSIS — C786 Secondary malignant neoplasm of retroperitoneum and peritoneum: Secondary | ICD-10-CM | POA: Diagnosis not present

## 2018-02-20 DIAGNOSIS — Z9049 Acquired absence of other specified parts of digestive tract: Secondary | ICD-10-CM | POA: Diagnosis not present

## 2018-02-20 DIAGNOSIS — Z5111 Encounter for antineoplastic chemotherapy: Secondary | ICD-10-CM | POA: Diagnosis not present

## 2018-03-06 DIAGNOSIS — C19 Malignant neoplasm of rectosigmoid junction: Secondary | ICD-10-CM | POA: Diagnosis not present

## 2018-03-06 DIAGNOSIS — C786 Secondary malignant neoplasm of retroperitoneum and peritoneum: Secondary | ICD-10-CM | POA: Diagnosis not present

## 2018-03-06 DIAGNOSIS — K5900 Constipation, unspecified: Secondary | ICD-10-CM | POA: Diagnosis not present

## 2018-03-06 DIAGNOSIS — C189 Malignant neoplasm of colon, unspecified: Secondary | ICD-10-CM | POA: Diagnosis not present

## 2018-03-06 DIAGNOSIS — Z5111 Encounter for antineoplastic chemotherapy: Secondary | ICD-10-CM | POA: Diagnosis not present

## 2018-03-20 DIAGNOSIS — C19 Malignant neoplasm of rectosigmoid junction: Secondary | ICD-10-CM | POA: Diagnosis not present

## 2018-03-20 DIAGNOSIS — Z5112 Encounter for antineoplastic immunotherapy: Secondary | ICD-10-CM | POA: Diagnosis not present

## 2018-03-20 DIAGNOSIS — Z5111 Encounter for antineoplastic chemotherapy: Secondary | ICD-10-CM | POA: Diagnosis not present

## 2018-03-20 DIAGNOSIS — C786 Secondary malignant neoplasm of retroperitoneum and peritoneum: Secondary | ICD-10-CM | POA: Diagnosis not present

## 2018-03-20 DIAGNOSIS — C189 Malignant neoplasm of colon, unspecified: Secondary | ICD-10-CM | POA: Diagnosis not present

## 2018-04-03 DIAGNOSIS — C19 Malignant neoplasm of rectosigmoid junction: Secondary | ICD-10-CM | POA: Diagnosis not present

## 2018-04-03 DIAGNOSIS — C786 Secondary malignant neoplasm of retroperitoneum and peritoneum: Secondary | ICD-10-CM | POA: Diagnosis not present

## 2018-04-03 DIAGNOSIS — R0609 Other forms of dyspnea: Secondary | ICD-10-CM | POA: Diagnosis not present

## 2018-04-03 DIAGNOSIS — K210 Gastro-esophageal reflux disease with esophagitis: Secondary | ICD-10-CM | POA: Diagnosis not present

## 2018-04-03 DIAGNOSIS — R0989 Other specified symptoms and signs involving the circulatory and respiratory systems: Secondary | ICD-10-CM | POA: Diagnosis not present

## 2018-04-03 DIAGNOSIS — T451X5A Adverse effect of antineoplastic and immunosuppressive drugs, initial encounter: Secondary | ICD-10-CM | POA: Diagnosis not present

## 2018-04-03 DIAGNOSIS — Z5112 Encounter for antineoplastic immunotherapy: Secondary | ICD-10-CM | POA: Diagnosis not present

## 2018-04-03 DIAGNOSIS — C189 Malignant neoplasm of colon, unspecified: Secondary | ICD-10-CM | POA: Diagnosis not present

## 2018-04-03 DIAGNOSIS — Z9049 Acquired absence of other specified parts of digestive tract: Secondary | ICD-10-CM | POA: Diagnosis not present

## 2018-04-03 DIAGNOSIS — Z5111 Encounter for antineoplastic chemotherapy: Secondary | ICD-10-CM | POA: Diagnosis not present

## 2018-04-10 DIAGNOSIS — C189 Malignant neoplasm of colon, unspecified: Secondary | ICD-10-CM | POA: Diagnosis not present

## 2018-04-14 DIAGNOSIS — R918 Other nonspecific abnormal finding of lung field: Secondary | ICD-10-CM | POA: Diagnosis not present

## 2018-04-14 DIAGNOSIS — C189 Malignant neoplasm of colon, unspecified: Secondary | ICD-10-CM | POA: Diagnosis not present

## 2018-04-14 DIAGNOSIS — C786 Secondary malignant neoplasm of retroperitoneum and peritoneum: Secondary | ICD-10-CM | POA: Diagnosis not present

## 2018-04-17 DIAGNOSIS — Z5112 Encounter for antineoplastic immunotherapy: Secondary | ICD-10-CM | POA: Diagnosis not present

## 2018-04-17 DIAGNOSIS — J029 Acute pharyngitis, unspecified: Secondary | ICD-10-CM | POA: Diagnosis not present

## 2018-04-17 DIAGNOSIS — C19 Malignant neoplasm of rectosigmoid junction: Secondary | ICD-10-CM | POA: Diagnosis not present

## 2018-04-17 DIAGNOSIS — Z5111 Encounter for antineoplastic chemotherapy: Secondary | ICD-10-CM | POA: Diagnosis not present

## 2018-04-17 DIAGNOSIS — C786 Secondary malignant neoplasm of retroperitoneum and peritoneum: Secondary | ICD-10-CM | POA: Diagnosis not present

## 2018-04-17 DIAGNOSIS — Z86711 Personal history of pulmonary embolism: Secondary | ICD-10-CM | POA: Diagnosis not present

## 2018-04-17 DIAGNOSIS — K219 Gastro-esophageal reflux disease without esophagitis: Secondary | ICD-10-CM | POA: Diagnosis not present

## 2018-04-17 DIAGNOSIS — J383 Other diseases of vocal cords: Secondary | ICD-10-CM | POA: Diagnosis not present

## 2018-04-17 DIAGNOSIS — Z9049 Acquired absence of other specified parts of digestive tract: Secondary | ICD-10-CM | POA: Diagnosis not present

## 2018-04-17 DIAGNOSIS — C189 Malignant neoplasm of colon, unspecified: Secondary | ICD-10-CM | POA: Diagnosis not present

## 2018-05-01 DIAGNOSIS — Z5111 Encounter for antineoplastic chemotherapy: Secondary | ICD-10-CM | POA: Diagnosis not present

## 2018-05-01 DIAGNOSIS — Z79899 Other long term (current) drug therapy: Secondary | ICD-10-CM | POA: Diagnosis not present

## 2018-05-01 DIAGNOSIS — Z9049 Acquired absence of other specified parts of digestive tract: Secondary | ICD-10-CM | POA: Diagnosis not present

## 2018-05-01 DIAGNOSIS — K210 Gastro-esophageal reflux disease with esophagitis: Secondary | ICD-10-CM | POA: Diagnosis not present

## 2018-05-01 DIAGNOSIS — C187 Malignant neoplasm of sigmoid colon: Secondary | ICD-10-CM | POA: Diagnosis not present

## 2018-05-01 DIAGNOSIS — C189 Malignant neoplasm of colon, unspecified: Secondary | ICD-10-CM | POA: Diagnosis not present

## 2018-05-01 DIAGNOSIS — C786 Secondary malignant neoplasm of retroperitoneum and peritoneum: Secondary | ICD-10-CM | POA: Diagnosis not present

## 2018-05-01 DIAGNOSIS — Z5112 Encounter for antineoplastic immunotherapy: Secondary | ICD-10-CM | POA: Diagnosis not present

## 2018-05-01 DIAGNOSIS — J3089 Other allergic rhinitis: Secondary | ICD-10-CM | POA: Diagnosis not present

## 2018-05-15 DIAGNOSIS — C799 Secondary malignant neoplasm of unspecified site: Secondary | ICD-10-CM | POA: Diagnosis not present

## 2018-05-15 DIAGNOSIS — Z5112 Encounter for antineoplastic immunotherapy: Secondary | ICD-10-CM | POA: Diagnosis not present

## 2018-05-15 DIAGNOSIS — Z5111 Encounter for antineoplastic chemotherapy: Secondary | ICD-10-CM | POA: Diagnosis not present

## 2018-05-15 DIAGNOSIS — C189 Malignant neoplasm of colon, unspecified: Secondary | ICD-10-CM | POA: Diagnosis not present

## 2018-05-29 DIAGNOSIS — C189 Malignant neoplasm of colon, unspecified: Secondary | ICD-10-CM | POA: Diagnosis not present

## 2018-05-29 DIAGNOSIS — Z5112 Encounter for antineoplastic immunotherapy: Secondary | ICD-10-CM | POA: Diagnosis not present

## 2018-05-29 DIAGNOSIS — K219 Gastro-esophageal reflux disease without esophagitis: Secondary | ICD-10-CM | POA: Diagnosis not present

## 2018-05-29 DIAGNOSIS — Z5111 Encounter for antineoplastic chemotherapy: Secondary | ICD-10-CM | POA: Diagnosis not present

## 2018-05-29 DIAGNOSIS — C786 Secondary malignant neoplasm of retroperitoneum and peritoneum: Secondary | ICD-10-CM | POA: Diagnosis not present

## 2018-05-29 DIAGNOSIS — J029 Acute pharyngitis, unspecified: Secondary | ICD-10-CM | POA: Diagnosis not present

## 2018-05-29 DIAGNOSIS — C19 Malignant neoplasm of rectosigmoid junction: Secondary | ICD-10-CM | POA: Diagnosis not present

## 2018-05-29 DIAGNOSIS — Z9049 Acquired absence of other specified parts of digestive tract: Secondary | ICD-10-CM | POA: Diagnosis not present

## 2018-06-09 DIAGNOSIS — C187 Malignant neoplasm of sigmoid colon: Secondary | ICD-10-CM | POA: Diagnosis not present

## 2018-06-09 DIAGNOSIS — C7989 Secondary malignant neoplasm of other specified sites: Secondary | ICD-10-CM | POA: Diagnosis not present

## 2018-06-12 DIAGNOSIS — C189 Malignant neoplasm of colon, unspecified: Secondary | ICD-10-CM | POA: Diagnosis not present

## 2018-06-12 DIAGNOSIS — K219 Gastro-esophageal reflux disease without esophagitis: Secondary | ICD-10-CM | POA: Diagnosis not present

## 2018-06-12 DIAGNOSIS — C786 Secondary malignant neoplasm of retroperitoneum and peritoneum: Secondary | ICD-10-CM | POA: Diagnosis not present

## 2018-06-12 DIAGNOSIS — Z5111 Encounter for antineoplastic chemotherapy: Secondary | ICD-10-CM | POA: Diagnosis not present

## 2018-06-12 DIAGNOSIS — R490 Dysphonia: Secondary | ICD-10-CM | POA: Diagnosis not present

## 2018-06-12 DIAGNOSIS — Z9049 Acquired absence of other specified parts of digestive tract: Secondary | ICD-10-CM | POA: Diagnosis not present

## 2018-06-12 DIAGNOSIS — R5383 Other fatigue: Secondary | ICD-10-CM | POA: Diagnosis not present

## 2018-06-12 DIAGNOSIS — H539 Unspecified visual disturbance: Secondary | ICD-10-CM | POA: Diagnosis not present

## 2018-06-12 DIAGNOSIS — C19 Malignant neoplasm of rectosigmoid junction: Secondary | ICD-10-CM | POA: Diagnosis not present

## 2018-06-12 DIAGNOSIS — Z1379 Encounter for other screening for genetic and chromosomal anomalies: Secondary | ICD-10-CM | POA: Diagnosis not present

## 2018-06-12 DIAGNOSIS — Z5112 Encounter for antineoplastic immunotherapy: Secondary | ICD-10-CM | POA: Diagnosis not present

## 2018-06-26 DIAGNOSIS — K219 Gastro-esophageal reflux disease without esophagitis: Secondary | ICD-10-CM | POA: Diagnosis not present

## 2018-06-26 DIAGNOSIS — R0602 Shortness of breath: Secondary | ICD-10-CM | POA: Diagnosis not present

## 2018-06-26 DIAGNOSIS — C786 Secondary malignant neoplasm of retroperitoneum and peritoneum: Secondary | ICD-10-CM | POA: Diagnosis not present

## 2018-06-26 DIAGNOSIS — R918 Other nonspecific abnormal finding of lung field: Secondary | ICD-10-CM | POA: Diagnosis not present

## 2018-06-26 DIAGNOSIS — I808 Phlebitis and thrombophlebitis of other sites: Secondary | ICD-10-CM | POA: Diagnosis not present

## 2018-06-26 DIAGNOSIS — C19 Malignant neoplasm of rectosigmoid junction: Secondary | ICD-10-CM | POA: Diagnosis not present

## 2018-06-26 DIAGNOSIS — J40 Bronchitis, not specified as acute or chronic: Secondary | ICD-10-CM | POA: Diagnosis not present

## 2018-06-26 DIAGNOSIS — R5383 Other fatigue: Secondary | ICD-10-CM | POA: Diagnosis not present

## 2018-06-26 DIAGNOSIS — C189 Malignant neoplasm of colon, unspecified: Secondary | ICD-10-CM | POA: Diagnosis not present

## 2018-06-26 DIAGNOSIS — M79602 Pain in left arm: Secondary | ICD-10-CM | POA: Diagnosis not present

## 2018-06-26 DIAGNOSIS — R490 Dysphonia: Secondary | ICD-10-CM | POA: Diagnosis not present

## 2018-07-03 DIAGNOSIS — C189 Malignant neoplasm of colon, unspecified: Secondary | ICD-10-CM | POA: Diagnosis not present

## 2018-07-03 DIAGNOSIS — C786 Secondary malignant neoplasm of retroperitoneum and peritoneum: Secondary | ICD-10-CM | POA: Diagnosis not present

## 2018-07-03 DIAGNOSIS — J40 Bronchitis, not specified as acute or chronic: Secondary | ICD-10-CM | POA: Diagnosis not present

## 2018-07-03 DIAGNOSIS — R5383 Other fatigue: Secondary | ICD-10-CM | POA: Diagnosis not present

## 2018-07-03 DIAGNOSIS — Z9049 Acquired absence of other specified parts of digestive tract: Secondary | ICD-10-CM | POA: Diagnosis not present

## 2018-07-03 DIAGNOSIS — R490 Dysphonia: Secondary | ICD-10-CM | POA: Diagnosis not present

## 2018-07-03 DIAGNOSIS — C19 Malignant neoplasm of rectosigmoid junction: Secondary | ICD-10-CM | POA: Diagnosis not present

## 2018-07-05 DIAGNOSIS — C799 Secondary malignant neoplasm of unspecified site: Secondary | ICD-10-CM | POA: Diagnosis not present

## 2018-07-05 DIAGNOSIS — C189 Malignant neoplasm of colon, unspecified: Secondary | ICD-10-CM | POA: Diagnosis not present

## 2018-07-05 DIAGNOSIS — Z86718 Personal history of other venous thrombosis and embolism: Secondary | ICD-10-CM | POA: Diagnosis not present

## 2018-07-05 DIAGNOSIS — Z5111 Encounter for antineoplastic chemotherapy: Secondary | ICD-10-CM | POA: Diagnosis not present

## 2018-07-05 DIAGNOSIS — Z5112 Encounter for antineoplastic immunotherapy: Secondary | ICD-10-CM | POA: Diagnosis not present

## 2018-07-05 DIAGNOSIS — I809 Phlebitis and thrombophlebitis of unspecified site: Secondary | ICD-10-CM | POA: Diagnosis not present

## 2018-07-11 ENCOUNTER — Encounter: Payer: Self-pay | Admitting: Pulmonary Disease

## 2018-07-11 ENCOUNTER — Ambulatory Visit (INDEPENDENT_AMBULATORY_CARE_PROVIDER_SITE_OTHER): Payer: Medicare Other | Admitting: Pulmonary Disease

## 2018-07-11 VITALS — BP 120/72 | HR 85 | Ht 66.0 in | Wt 170.4 lb

## 2018-07-11 DIAGNOSIS — R0602 Shortness of breath: Secondary | ICD-10-CM | POA: Diagnosis not present

## 2018-07-11 DIAGNOSIS — R490 Dysphonia: Secondary | ICD-10-CM

## 2018-07-11 DIAGNOSIS — J984 Other disorders of lung: Secondary | ICD-10-CM

## 2018-07-11 DIAGNOSIS — E049 Nontoxic goiter, unspecified: Secondary | ICD-10-CM | POA: Diagnosis not present

## 2018-07-11 NOTE — Patient Instructions (Addendum)
Your hoarseness may be related to sinus drip hard to anatomic issues with the voice box  Trial of Claritin once daily Store brand Sudafed/phenylephrine 10 mg daily for 2 weeks  ENT consultation for goiter and hoarseness  Lung function shows moderate restriction Okay to keep taking Ventolin 2 puffs prior to your walks

## 2018-07-11 NOTE — Progress Notes (Signed)
Subjective:    Patient ID: Tonya Stanley, female    DOB: [birth year], 72 y.o.   MRN: [id number]  HPI  Chief Complaint  Patient presents with  . Pulm Consult    Referred by Dr. Manuella Ghazi for wheezing. Patient woke up 6 months ago and had a hard time catching her breath. She is undergoing chemo for colon cancer. Has lost her voice as well. Denies any coughing.     72 year old remote smoker referred by [facility] oncology for persistent hoarseness and episodic dyspnea. She was unfortunately diagnosed with metastatic colon cancer in 01/2018 and has been undergoing chemotherapy at [facility].  Colonoscopy initially showed an infiltrating poorly differentiated adenocarcinoma in the rectosigmoid junction.  Exploratory laparotomy unfortunately showed diffuse peritoneal metastases.  She was started on FOLFOX chemotherapy via port and Avastin was added.  In 02/2018 she developed delayed reaction with hoarseness and throat swelling which was felt to be due to oxaliplatin.  She underwent desensitization to this medication but this was eventually discontinued she was then transition to 5-FU leucovorin combination.  She developed dyspnea fatigue and left upper extremity thrombophlebitis around first week of December and underwent CT angiogram which was negative for pulmonary embolism.   She feels that dyspnea has now resolved.  Her main complaint is persistent hoarseness since 02/2018.  She has an active lifestyle and walks 3 to 5 miles every morning.  Years ago she was diagnosed with exercise-induced asthma and since then she uses Ventolin MDI prior to exercise and this seems to work well for her.  She denies significant reflux symptoms and has taken a PPI for 4 weeks without any benefit.  She reports occasional sinus drip   She has smoked less than 20 pack years before she quit in 2000.  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 Liver lesion   . Paroxysmal atrial fibrillation ([place])    RFAblation 4/14 [facility]  . Pulmonary embolism ([place])    post RFCA atrial fib  [facility] 4/14    Past Surgical History:  Procedure Laterality Date  . ABLATION OF DYSRHYTHMIC FOCUS  11/13/2012  . COLONOSCOPY    . TONSILLECTOMY      Allergies  Allergen Reactions  . Erythromycin Nausea And Vomiting  . Macrobid [Nitrofurantoin Macrocrystal] Nausea And Vomiting    Social History   Socioeconomic History  . Marital status: Married    Spouse name: Not on file  . Number of children: Not on file  . Years of education: Not on file  . Highest education level: Not on file  Occupational History  . Not on file  Social Needs  . Financial resource strain: Not on file  . Food insecurity:    Worry: Not on file    Inability: Not on file  . Transportation needs:    Medical: Not on file    Non-medical: Not on file  Tobacco Use  . Smoking status: Never Smoker  .  Smokeless tobacco: Never Used  Substance and Sexual Activity  . Alcohol use: No  . Drug use: No  . Sexual activity: Never  Lifestyle  . Physical activity:    Days per week: Not on file    Minutes per session: Not on file  . Stress: Not on file  Relationships  . Social connections:    Talks on phone: Not on file    Gets together: Not on file    Attends religious service: Not on file    Active member of club or organization: Not on file    Attends meetings of clubs or organizations: Not on file    Relationship  status: Not on file  . Intimate partner violence:    Fear of current or ex partner: Not on file    Emotionally abused: Not on file    Physically abused: Not on file    Forced sexual activity: Not on file  Other Topics Concern  . Not on file  Social History Narrative  . Not on file     Family History  Problem Relation Age of Onset  . Hypertension Other      Review of Systems   Constitutional: negative for anorexia, fevers and sweats  Eyes: negative for irritation, redness and visual disturbance  Ears, nose, mouth, throat, and face: negative for earaches, epistaxis, nasal congestion and sore throat  Respiratory: negative for cough, dyspnea on exertion, sputum and wheezing  Cardiovascular: negative for chest pain, dyspnea, lower extremity edema, orthopnea, palpitations and syncope  Gastrointestinal: negative for abdominal pain, constipation, diarrhea, melena, nausea and vomiting  Genitourinary:negative for dysuria, frequency and hematuria  Hematologic/lymphatic: negative for bleeding, easy bruising and lymphadenopathy  Musculoskeletal:negative for arthralgias, muscle weakness and stiff joints  Neurological: negative for coordination problems, gait problems, headaches and weakness  Endocrine: negative for diabetic symptoms including polydipsia, polyuria and weight loss     Objective:   Physical Exam  Gen. Pleasant, obese, in no distress, normal affect ENT - no lesions, no post nasal drip, class 2-3 airway Neck: No JVD, no thyromegaly, no carotid bruits Lungs: no use of accessory muscles, no dullness to percussion, decreased without rales or rhonchi , coarse BS Right base Cardiovascular: Rhythm regular, heart sounds  normal, no murmurs or gallops, no peripheral edema Abdomen: soft and non-tender, no hepatosplenomegaly, BS normal. Musculoskeletal: No deformities, no cyanosis or clubbing Neuro:  alert, non focal, no tremors       Assessment & Plan:

## 2018-07-11 NOTE — Assessment & Plan Note (Signed)
This seems to be her main complaint, dyspnea is resolved hoarseness may be related to sinus drip hard to anatomic issues with the voice box  Trial of Claritin once daily Store brand Sudafed/phenylephrine 10 mg daily for 2 weeks  ENT consultation for goiter and hoarseness

## 2018-07-11 NOTE — Assessment & Plan Note (Signed)
Lung function shows moderate restriction -this may be related to effort Okay to keep taking Ventolin 2 puffs prior to your walks -based on a prior diagnosis of exercise-induced asthma and subjective benefit  CT chest shows bronchial thickening both lower lobes, this may be related to chemotherapy or prior insult

## 2018-08-27 ENCOUNTER — Inpatient Hospital Stay (HOSPITAL_COMMUNITY)
Admission: EM | Admit: 2018-08-27 | Discharge: 2018-08-30 | DRG: 375 | Disposition: A | Discharge status: Home / Self Care | Payer: Medicare Other | Attending: Internal Medicine | Admitting: Internal Medicine

## 2018-08-27 ENCOUNTER — Emergency Department (HOSPITAL_COMMUNITY): Payer: Medicare Other

## 2018-08-27 ENCOUNTER — Encounter (HOSPITAL_COMMUNITY): Payer: Self-pay

## 2018-08-27 ENCOUNTER — Other Ambulatory Visit: Payer: Self-pay

## 2018-08-27 DIAGNOSIS — Z8249 Family history of ischemic heart disease and other diseases of the circulatory system: Secondary | ICD-10-CM

## 2018-08-27 DIAGNOSIS — Z9049 Acquired absence of other specified parts of digestive tract: Secondary | ICD-10-CM

## 2018-08-27 DIAGNOSIS — Z0189 Encounter for other specified special examinations: Secondary | ICD-10-CM

## 2018-08-27 DIAGNOSIS — K56609 Unspecified intestinal obstruction, unspecified as to partial versus complete obstruction: Secondary | ICD-10-CM | POA: Diagnosis present

## 2018-08-27 DIAGNOSIS — C801 Malignant (primary) neoplasm, unspecified: Secondary | ICD-10-CM

## 2018-08-27 DIAGNOSIS — I4891 Unspecified atrial fibrillation: Secondary | ICD-10-CM | POA: Diagnosis present

## 2018-08-27 DIAGNOSIS — C786 Secondary malignant neoplasm of retroperitoneum and peritoneum: Secondary | ICD-10-CM

## 2018-08-27 DIAGNOSIS — K7689 Other specified diseases of liver: Secondary | ICD-10-CM

## 2018-08-27 DIAGNOSIS — Z881 Allergy status to other antibiotic agents status: Secondary | ICD-10-CM

## 2018-08-27 DIAGNOSIS — I480 Paroxysmal atrial fibrillation: Secondary | ICD-10-CM | POA: Diagnosis present

## 2018-08-27 DIAGNOSIS — I82409 Acute embolism and thrombosis of unspecified deep veins of unspecified lower extremity: Secondary | ICD-10-CM | POA: Diagnosis present

## 2018-08-27 DIAGNOSIS — Z9221 Personal history of antineoplastic chemotherapy: Secondary | ICD-10-CM

## 2018-08-27 DIAGNOSIS — E079 Disorder of thyroid, unspecified: Secondary | ICD-10-CM | POA: Diagnosis present

## 2018-08-27 DIAGNOSIS — E042 Nontoxic multinodular goiter: Secondary | ICD-10-CM | POA: Diagnosis present

## 2018-08-27 DIAGNOSIS — Z5111 Encounter for antineoplastic chemotherapy: Secondary | ICD-10-CM

## 2018-08-27 DIAGNOSIS — I10 Essential (primary) hypertension: Secondary | ICD-10-CM | POA: Diagnosis present

## 2018-08-27 DIAGNOSIS — Z86711 Personal history of pulmonary embolism: Secondary | ICD-10-CM | POA: Diagnosis present

## 2018-08-27 DIAGNOSIS — Z86718 Personal history of other venous thrombosis and embolism: Secondary | ICD-10-CM

## 2018-08-27 DIAGNOSIS — C187 Malignant neoplasm of sigmoid colon: Secondary | ICD-10-CM | POA: Diagnosis not present

## 2018-08-27 DIAGNOSIS — C189 Malignant neoplasm of colon, unspecified: Secondary | ICD-10-CM | POA: Diagnosis present

## 2018-08-27 DIAGNOSIS — Z7901 Long term (current) use of anticoagulants: Secondary | ICD-10-CM

## 2018-08-27 DIAGNOSIS — C800 Disseminated malignant neoplasm, unspecified: Secondary | ICD-10-CM

## 2018-08-27 HISTORY — DX: Secondary malignant neoplasm of retroperitoneum and peritoneum: C78.6

## 2018-08-27 HISTORY — DX: Personal history of colonic polyps: Z86.010

## 2018-08-27 HISTORY — DX: Malignant (primary) neoplasm, unspecified: C80.1

## 2018-08-27 HISTORY — DX: Other specified diseases of liver: K76.89

## 2018-08-27 HISTORY — DX: Malignant neoplasm of sigmoid colon: C18.7

## 2018-08-27 LAB — CBC
HEMATOCRIT: 42.7 % (ref 36.0–46.0)
HEMOGLOBIN: 13.5 g/dL (ref 12.0–15.0)
MCH: 29.5 pg (ref 26.0–34.0)
MCHC: 31.6 g/dL (ref 30.0–36.0)
MCV: 93.4 fL (ref 80.0–100.0)
Platelets: 296 10*3/uL (ref 150–400)
RBC: 4.57 MIL/uL (ref 3.87–5.11)
RDW: 14.3 % (ref 11.5–15.5)
WBC: 12.2 10*3/uL — ABNORMAL HIGH (ref 4.0–10.5)
nRBC: 0 % (ref 0.0–0.2)

## 2018-08-27 LAB — COMPREHENSIVE METABOLIC PANEL
ALBUMIN: 4.2 g/dL (ref 3.5–5.0)
ALK PHOS: 54 U/L (ref 38–126)
ALT: 15 U/L (ref 0–44)
AST: 20 U/L (ref 15–41)
Anion gap: 11 (ref 5–15)
BILIRUBIN TOTAL: 0.7 mg/dL (ref 0.3–1.2)
BUN: 11 mg/dL (ref 8–23)
CALCIUM: 9.6 mg/dL (ref 8.9–10.3)
CO2: 24 mmol/L (ref 22–32)
CREATININE: 0.89 mg/dL (ref 0.44–1.00)
Chloride: 103 mmol/L (ref 98–111)
GFR calc Af Amer: >60 mL/min (ref >60)
GLUCOSE: 154 mg/dL — AB (ref 70–99)
Potassium: 4 mmol/L (ref 3.5–5.1)
Sodium: 138 mmol/L (ref 135–145)
TOTAL PROTEIN: 7.2 g/dL (ref 6.5–8.1)

## 2018-08-27 LAB — LIPASE, BLOOD: Lipase: 23 U/L (ref 11–51)

## 2018-08-27 MED ORDER — SODIUM CHLORIDE (PF) 0.9 % IJ SOLN
INTRAMUSCULAR | Status: AC
  Filled 2018-08-27: qty 50

## 2018-08-27 MED ORDER — MORPHINE SULFATE (PF) 4 MG/ML IV SOLN
4.0000 mg | Freq: Once | INTRAVENOUS | Status: AC
  Administered 2018-08-27: 4 mg via INTRAVENOUS
  Filled 2018-08-27: qty 1

## 2018-08-27 MED ORDER — ONDANSETRON HCL 4 MG/2ML IJ SOLN
4.0000 mg | Freq: Once | INTRAMUSCULAR | Status: AC
  Administered 2018-08-27: 4 mg via INTRAVENOUS
  Filled 2018-08-27: qty 2

## 2018-08-27 MED ORDER — IOPAMIDOL (ISOVUE-300) INJECTION 61%
100.0000 mL | Freq: Once | INTRAVENOUS | Status: AC | PRN
  Administered 2018-08-27: 100 mL via INTRAVENOUS

## 2018-08-27 MED ORDER — SODIUM CHLORIDE 0.9 % IV BOLUS
1000.0000 mL | Freq: Once | INTRAVENOUS | Status: AC
Start: 2018-08-27 — End: 2018-08-27
  Administered 2018-08-27: 1000 mL via INTRAVENOUS

## 2018-08-27 MED ORDER — IOPAMIDOL (ISOVUE-300) INJECTION 61%
INTRAVENOUS | Status: AC
  Filled 2018-08-27: qty 100

## 2018-08-27 NOTE — ED Triage Notes (Addendum)
Pt reports abdominal pain and vomiting that started at 6p. Hx of colon cancer. Reports last BM was about 30 mins ago and was normal for her. Denies any blood in stool. Pt concerned that she has a blockage.

## 2018-08-27 NOTE — ED Provider Notes (Signed)
[facility] DEPT Provider Note   CSN: [id number] Arrival date & time: 08/27/18  2129     History   Chief Complaint Chief Complaint  Patient presents with  . Abdominal Pain    HPI SHARECE FLEISCHHACKER is a 73 y.o. female.  The history is provided by the patient and medical records.  Abdominal Pain  Associated symptoms: nausea and vomiting      74 y.o. F with hx of PAF, hx of DVT and PE on Eliquis, hx of colon cancer with last chemo 2 weeks ago, presenting to the ED for abdominal pain.  States this started at around 6:30 PM and has gotten worse since that time.  Pain localized to periumbilical region, no radiation.  Deep, intense pain.  Has had associated nausea/vomiting.  No diarrhea.  States she feels bloated and is concerned she has a "blockage".  She does have hx of SBO.  She is followed by heme/onc at [facility], Dr. Manuella Ghazi.  Past Medical History:  Diagnosis Date  . Liver lesion   . Paroxysmal atrial fibrillation ([place])    RFAblation 4/14 [facility]  . Pulmonary embolism ([place])    post RFCA atrial fib  [facility] 4/14    Patient Active Problem List   Diagnosis Date Noted  . Hoarseness 07/11/2018  . Goiter 07/11/2018  . Restrictive lung disease 07/11/2018  . HTN (hypertension) 10/02/2017  . Enteritis 10/02/2017  . Partial small bowel obstruction ([place]) 10/01/2017  . SBO (small bowel obstruction) ([place]) 10/01/2017  . H/o Atrial fibrillation ([place]) 06/28/2012    Past Surgical History:  Procedure Laterality Date  . ABLATION OF DYSRHYTHMIC FOCUS  11/13/2012  . COLONOSCOPY    . TONSILLECTOMY       OB History   No obstetric history on file.      Home Medications    Prior to Admission medications   Medication Sig Start Date End Date Taking? Authorizing Provider  Biotin 1000 MCG tablet Take 5,000 mcg by mouth daily.     [provider]  Cholecalciferol (VITAMIN D3) 1000 units CAPS Take 4,000 Units by mouth daily.    [provider]  MAGNESIUM  PO Take 1 capsule by mouth daily.    [provider]  Omega-3 Fatty Acids (FISH OIL) 1000 MG CAPS Take 1,000 mg by mouth daily.    [provider]  pantoprazole (PROTONIX) 40 MG tablet Take by mouth. 04/03/18 04/03/19  [provider]    Family History Family History  Problem Relation Age of Onset  . Hypertension Other     Social History Social History   Tobacco Use  . Smoking status: Never Smoker  . Smokeless tobacco: Never Used  Substance Use Topics  . Alcohol use: No  . Drug use: No     Allergies   Erythromycin and Macrobid [nitrofurantoin macrocrystal]   Review of Systems Review of Systems  Gastrointestinal: Positive for abdominal pain, nausea and vomiting.  All other systems reviewed and are negative.    Physical Exam Updated Vital Signs BP (!) 148/98 (BP Location: Left Arm)   Pulse 90   Temp (!) 97.4 F (36.3 C) (Oral)   Resp 16   SpO2 98%   Physical Exam Vitals signs and nursing note reviewed.  Constitutional:      Appearance: She is well-developed.  HENT:     Head: Normocephalic and atraumatic.  Eyes:     Conjunctiva/sclera: Conjunctivae normal.     Pupils: Pupils are equal, round, and reactive  to light.  Neck:     Musculoskeletal: Normal range of motion.  Cardiovascular:     Rate and Rhythm: Normal rate and regular rhythm.     Heart sounds: Normal heart sounds.  Pulmonary:     Effort: Pulmonary effort is normal.     Breath sounds: Normal breath sounds.  Abdominal:     General: Bowel sounds are decreased. There is distension.     Palpations: Abdomen is soft.     Tenderness: There is abdominal tenderness in the periumbilical area.  Musculoskeletal: Normal range of motion.  Skin:    General: Skin is warm and dry.  Neurological:     Mental Status: She is alert and oriented to person, place, and time.      ED Treatments / Results  Labs (all labs ordered are listed, but only abnormal results are displayed) Labs  Reviewed  COMPREHENSIVE METABOLIC PANEL - Abnormal; Notable for the following components:      Result Value   Glucose, Bld 154 (*)    All other components within normal limits  CBC - Abnormal; Notable for the following components:   WBC 12.2 (*)    All other components within normal limits  LIPASE, BLOOD  URINALYSIS, ROUTINE W REFLEX MICROSCOPIC    EKG None  Radiology Ct Abdomen Pelvis W Contrast  Result Date: 08/28/2018 CLINICAL DATA:  Abdominal pain, nausea, vomiting EXAM: CT ABDOMEN AND PELVIS WITH CONTRAST TECHNIQUE: Multidetector CT imaging of the abdomen and pelvis was performed using the standard protocol following bolus administration of intravenous contrast. CONTRAST:  142mL ISOVUE-300 IOPAMIDOL (ISOVUE-300) INJECTION 61% COMPARISON:  09/30/2017 FINDINGS: Lower chest: No acute abnormality. Hepatobiliary: Multiple hypodense, fluid attenuating hepatic masses with the largest in the left hepatic lobe measuring 5 x 4.1 cm consistent with cysts. No solid hepatic mass. No intrahepatic or extrahepatic biliary ductal dilatation. Normal gallbladder. Pancreas: Unremarkable. No pancreatic ductal dilatation or surrounding inflammatory changes. Spleen: Normal in size without focal abnormality. Adrenals/Urinary Tract: Adrenal glands are unremarkable. 10 mm hypodense, fluid attenuating right renal mass most consistent with a small cyst. Kidneys are otherwise normal, without renal calculi, focal lesion, or hydronephrosis. Bladder is unremarkable. Stomach/Bowel: Stomach is within normal limits. Multiple dilated loops of small bowel measuring up to 3 cm. Multiple fluid-filled loops of small bowel with multiple air-fluid levels. Relative transition point in the right lower quadrant. Bowel appears to be tethered in the right lower quadrant suggesting adhesions. No pneumatosis, pneumoperitoneum or portal venous gas. Vascular/Lymphatic: Normal caliber abdominal aorta with atherosclerosis. No lymphadenopathy.  Reproductive: Uterus and bilateral adnexa are unremarkable. Other: No abdominal wall hernia or abnormality. Small amount of pelvic free fluid. Musculoskeletal: No acute osseous abnormality. No aggressive osseous lesion. Mild osteoarthritis of bilateral SI joints. 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 Bilateral facet arthropathy at L3-4. IMPRESSION: 1. Multiple dilated loops of small bowel with air-fluid levels with a relative transition point in the right lower quadrant most concerning for a small bowel obstruction likely secondary to adhesions. 2.  Aortic Atherosclerosis (ICD10-I70.0). 3. Multiple liver cysts. Electronically Signed   By: Kathreen Devoid   On: 08/28/2018 00:07    Procedures Procedures (including critical care time)  Medications Ordered in ED Medications  iopamidol (ISOVUE-300) 61 % injection (has no administration in time range)  sodium chloride (PF) 0.9 % injection (has no administration in time range)  ondansetron (ZOFRAN) injection 4 mg (4 mg Intravenous Given 08/27/18 2219)  sodium chloride 0.9 %  bolus 1,000 mL (0 mLs Intravenous Stopped 08/27/18 2359)  morphine 4 MG/ML injection 4 mg (4 mg Intravenous Given 08/27/18 2220)  iopamidol (ISOVUE-300) 61 % injection 100 mL (100 mLs Intravenous Contrast Given 08/27/18 2342)  morphine 4 MG/ML injection 4 mg (4 mg Intravenous Given 08/28/18 0017)     Initial Impression / Assessment and Plan / ED Course  I have reviewed the triage vital signs and the nursing notes.  Pertinent labs & imaging results that were available during my care of the patient were reviewed by me and considered in my medical decision making (see chart for details).  73 year old female with history of colon cancer not currently receiving chemotherapy, presenting to the ED with abdominal pain.  Pain began around 6:30 PM and has been worsening.  She has had some nausea and vomiting.  She is afebrile and nontoxic but  does appear uncomfortable.  Abdomen is distended with decreased bowel sounds.  Concern for SBO, patient has history of same.  Plan for screening labs as well as CT.  Labs overall reassuring.  CT does confirm small bowel obstruction with transition point in the right lower abdomen.  Imaging with suspicion of adhesions as source of obstruction.  Results discussed with patient and family at the bedside.  I discussed with her regarding transfer to [facility] as she is receiving her oncology care there.  She inquired about what will be the course of action in this hospital if she were to stay here.  I discussed with her trial of medical management with NG tube, bowel rest, slow progression of diet as surgery is usually last option if SBO not resolving with other measures.  She elected for admission here.  NG tube to be placed.  Will discuss with general surgery, likely medical admission.  12:25 AM Discussed with Dr. Marlou Starks-- team will evaluate in the morning and will follow along.  Discussed with Dr. Hal Hope-- he will admit for ongoing care.  Final Clinical Impressions(s) / ED Diagnoses   Final diagnoses:  SBO (small bowel obstruction) [facility])    ED Discharge Orders    None       Larene Pickett, PA-C 08/28/18 0110    Mesner, Corene Cornea, MD 08/29/18 [phone]

## 2018-08-28 ENCOUNTER — Encounter (HOSPITAL_COMMUNITY): Payer: Self-pay | Admitting: Internal Medicine

## 2018-08-28 ENCOUNTER — Inpatient Hospital Stay (HOSPITAL_COMMUNITY): Payer: Medicare Other

## 2018-08-28 DIAGNOSIS — I480 Paroxysmal atrial fibrillation: Secondary | ICD-10-CM | POA: Diagnosis present

## 2018-08-28 DIAGNOSIS — Z86711 Personal history of pulmonary embolism: Secondary | ICD-10-CM | POA: Diagnosis not present

## 2018-08-28 DIAGNOSIS — I10 Essential (primary) hypertension: Secondary | ICD-10-CM | POA: Diagnosis present

## 2018-08-28 DIAGNOSIS — E079 Disorder of thyroid, unspecified: Secondary | ICD-10-CM | POA: Diagnosis present

## 2018-08-28 DIAGNOSIS — Z881 Allergy status to other antibiotic agents status: Secondary | ICD-10-CM | POA: Diagnosis not present

## 2018-08-28 DIAGNOSIS — C187 Malignant neoplasm of sigmoid colon: Secondary | ICD-10-CM

## 2018-08-28 DIAGNOSIS — I82409 Acute embolism and thrombosis of unspecified deep veins of unspecified lower extremity: Secondary | ICD-10-CM | POA: Diagnosis present

## 2018-08-28 DIAGNOSIS — Z5111 Encounter for antineoplastic chemotherapy: Secondary | ICD-10-CM

## 2018-08-28 DIAGNOSIS — I4819 Other persistent atrial fibrillation: Secondary | ICD-10-CM | POA: Diagnosis not present

## 2018-08-28 DIAGNOSIS — Z9049 Acquired absence of other specified parts of digestive tract: Secondary | ICD-10-CM | POA: Diagnosis not present

## 2018-08-28 DIAGNOSIS — Z7901 Long term (current) use of anticoagulants: Secondary | ICD-10-CM | POA: Diagnosis not present

## 2018-08-28 DIAGNOSIS — Z9221 Personal history of antineoplastic chemotherapy: Secondary | ICD-10-CM | POA: Diagnosis not present

## 2018-08-28 DIAGNOSIS — I82722 Chronic embolism and thrombosis of deep veins of left upper extremity: Secondary | ICD-10-CM | POA: Diagnosis not present

## 2018-08-28 DIAGNOSIS — C786 Secondary malignant neoplasm of retroperitoneum and peritoneum: Secondary | ICD-10-CM | POA: Diagnosis present

## 2018-08-28 DIAGNOSIS — C800 Disseminated malignant neoplasm, unspecified: Secondary | ICD-10-CM | POA: Diagnosis not present

## 2018-08-28 DIAGNOSIS — K56609 Unspecified intestinal obstruction, unspecified as to partial versus complete obstruction: Secondary | ICD-10-CM

## 2018-08-28 DIAGNOSIS — K7689 Other specified diseases of liver: Secondary | ICD-10-CM

## 2018-08-28 DIAGNOSIS — Z8249 Family history of ischemic heart disease and other diseases of the circulatory system: Secondary | ICD-10-CM | POA: Diagnosis not present

## 2018-08-28 DIAGNOSIS — Z86718 Personal history of other venous thrombosis and embolism: Secondary | ICD-10-CM | POA: Diagnosis not present

## 2018-08-28 DIAGNOSIS — I4811 Longstanding persistent atrial fibrillation: Secondary | ICD-10-CM | POA: Diagnosis not present

## 2018-08-28 HISTORY — DX: Malignant neoplasm of sigmoid colon: C18.7

## 2018-08-28 HISTORY — DX: Other specified diseases of liver: K76.89

## 2018-08-28 LAB — BASIC METABOLIC PANEL
Anion gap: 4 — ABNORMAL LOW (ref 5–15)
BUN: 9 mg/dL (ref 8–23)
CO2: 27 mmol/L (ref 22–32)
Calcium: 8.7 mg/dL — ABNORMAL LOW (ref 8.9–10.3)
Chloride: 107 mmol/L (ref 98–111)
Creatinine, Ser: 0.75 mg/dL (ref 0.44–1.00)
GFR calc Af Amer: >60 mL/min (ref >60)
GFR calc non Af Amer: >60 mL/min (ref >60)
Glucose, Bld: 111 mg/dL — ABNORMAL HIGH (ref 70–99)
Potassium: 4.3 mmol/L (ref 3.5–5.1)
Sodium: 138 mmol/L (ref 135–145)

## 2018-08-28 LAB — CBC WITH DIFFERENTIAL/PLATELET
Abs Immature Granulocytes: 0.03 10*3/uL (ref 0.00–0.07)
Basophils Absolute: 0 10*3/uL (ref 0.0–0.1)
Basophils Relative: 0 %
Eosinophils Absolute: 0 10*3/uL (ref 0.0–0.5)
Eosinophils Relative: 0 %
HCT: 38.1 % (ref 36.0–46.0)
Hemoglobin: 11.7 g/dL — ABNORMAL LOW (ref 12.0–15.0)
Immature Granulocytes: 0 %
Lymphocytes Relative: 21 %
Lymphs Abs: 1.5 10*3/uL (ref 0.7–4.0)
MCH: 29.8 pg (ref 26.0–34.0)
MCHC: 30.7 g/dL (ref 30.0–36.0)
MCV: 96.9 fL (ref 80.0–100.0)
MONOS PCT: 8 %
Monocytes Absolute: 0.6 10*3/uL (ref 0.1–1.0)
Neutro Abs: 5.1 10*3/uL (ref 1.7–7.7)
Neutrophils Relative %: 71 %
Platelets: 226 10*3/uL (ref 150–400)
RBC: 3.93 MIL/uL (ref 3.87–5.11)
RDW: 14.3 % (ref 11.5–15.5)
WBC: 7.2 10*3/uL (ref 4.0–10.5)
nRBC: 0 % (ref 0.0–0.2)

## 2018-08-28 LAB — HIV ANTIBODY (ROUTINE TESTING W REFLEX): HIV Screen 4th Generation wRfx: NONREACTIVE

## 2018-08-28 LAB — URINALYSIS, ROUTINE W REFLEX MICROSCOPIC
Bilirubin Urine: NEGATIVE
GLUCOSE, UA: NEGATIVE mg/dL
Ketones, ur: NEGATIVE mg/dL
NITRITE: NEGATIVE
PH: 5 (ref 5.0–8.0)
Protein, ur: NEGATIVE mg/dL
Specific Gravity, Urine: >1.046 — ABNORMAL HIGH (ref 1.005–1.030)
WBC, UA: >50 WBC/hpf — ABNORMAL HIGH (ref 0–5)

## 2018-08-28 LAB — APTT
aPTT: 29 seconds (ref 24–36)
aPTT: 78 seconds — ABNORMAL HIGH (ref 24–36)
aPTT: 103 seconds — ABNORMAL HIGH (ref 24–36)

## 2018-08-28 LAB — HEPATIC FUNCTION PANEL
ALK PHOS: 44 U/L (ref 38–126)
ALT: 12 U/L (ref 0–44)
AST: 16 U/L (ref 15–41)
Albumin: 3.3 g/dL — ABNORMAL LOW (ref 3.5–5.0)
BILIRUBIN TOTAL: 0.4 mg/dL (ref 0.3–1.2)
Bilirubin, Direct: 0.1 mg/dL (ref 0.0–0.2)
Indirect Bilirubin: 0.3 mg/dL (ref 0.3–0.9)
Total Protein: 6 g/dL — ABNORMAL LOW (ref 6.5–8.1)

## 2018-08-28 LAB — HEPARIN LEVEL (UNFRACTIONATED)
Heparin Unfractionated: 0.56 IU/mL (ref 0.30–0.70)
Heparin Unfractionated: 0.92 IU/mL — ABNORMAL HIGH (ref 0.30–0.70)

## 2018-08-28 LAB — GLUCOSE, CAPILLARY
GLUCOSE-CAPILLARY: 93 mg/dL (ref 70–99)
Glucose-Capillary: 95 mg/dL (ref 70–99)
Glucose-Capillary: 103 mg/dL — ABNORMAL HIGH (ref 70–99)

## 2018-08-28 MED ORDER — DIPHENHYDRAMINE HCL 50 MG/ML IJ SOLN: 12.5000 mg | Freq: Four times a day (QID) | INTRAMUSCULAR | Status: DC | PRN

## 2018-08-28 MED ORDER — PROMETHAZINE HCL 25 MG/ML IJ SOLN
12.5000 mg | INTRAMUSCULAR | Status: DC | PRN
  Administered 2018-08-28 – 2018-08-29 (×2): 12.5 mg via INTRAVENOUS
  Filled 2018-08-28 (×2): qty 1

## 2018-08-28 MED ORDER — HEPARIN (PORCINE) 25000 UT/250ML-% IV SOLN
950.0000 [IU]/h | INTRAVENOUS | Status: DC
  Administered 2018-08-29: 950 [IU]/h via INTRAVENOUS
  Filled 2018-08-28: qty 250

## 2018-08-28 MED ORDER — MENTHOL 3 MG MT LOZG
1.0000 | LOZENGE | OROMUCOSAL | Status: DC | PRN
  Filled 2018-08-28: qty 9

## 2018-08-28 MED ORDER — PHENOL 1.4 % MT LIQD
1.0000 | OROMUCOSAL | Status: DC | PRN
  Filled 2018-08-28: qty 177

## 2018-08-28 MED ORDER — MORPHINE SULFATE (PF) 2 MG/ML IV SOLN
2.0000 mg | INTRAVENOUS | Status: DC | PRN
  Administered 2018-08-28 – 2018-08-29 (×2): 2 mg via INTRAVENOUS
  Filled 2018-08-28 (×2): qty 1

## 2018-08-28 MED ORDER — SODIUM CHLORIDE 0.9% FLUSH
10.0000 mL | INTRAVENOUS | Status: DC | PRN
  Administered 2018-08-30: 10 mL
  Filled 2018-08-28: qty 40

## 2018-08-28 MED ORDER — SODIUM CHLORIDE 0.9 % IV SOLN
INTRAVENOUS | Status: DC
  Administered 2018-08-28 (×3): via INTRAVENOUS

## 2018-08-28 MED ORDER — ACETAMINOPHEN 325 MG PO TABS: 650.0000 mg | ORAL_TABLET | Freq: Four times a day (QID) | ORAL | Status: DC | PRN

## 2018-08-28 MED ORDER — ONDANSETRON HCL 4 MG/2ML IJ SOLN
4.0000 mg | Freq: Four times a day (QID) | INTRAMUSCULAR | Status: DC | PRN
  Filled 2018-08-28: qty 2

## 2018-08-28 MED ORDER — MAGIC MOUTHWASH
15.0000 mL | Freq: Four times a day (QID) | ORAL | Status: DC | PRN
  Filled 2018-08-28: qty 15

## 2018-08-28 MED ORDER — LACTATED RINGERS IV BOLUS: 1000.0000 mL | Freq: Three times a day (TID) | INTRAVENOUS | Status: AC | PRN

## 2018-08-28 MED ORDER — LIDOCAINE HCL URETHRAL/MUCOSAL 2 % EX GEL
1.0000 "application " | Freq: Once | CUTANEOUS | Status: AC
  Administered 2018-08-28: 1 via TOPICAL
  Filled 2018-08-28: qty 5

## 2018-08-28 MED ORDER — LIP MEDEX EX OINT
1.0000 "application " | TOPICAL_OINTMENT | Freq: Two times a day (BID) | CUTANEOUS | Status: DC
  Administered 2018-08-28 – 2018-08-29 (×4): 1 via TOPICAL
  Filled 2018-08-28 (×2): qty 7

## 2018-08-28 MED ORDER — BISACODYL 10 MG RE SUPP
10.0000 mg | Freq: Every day | RECTAL | Status: DC
  Administered 2018-08-28: 10 mg via RECTAL
  Filled 2018-08-28: qty 1

## 2018-08-28 MED ORDER — ALUM & MAG HYDROXIDE-SIMETH 200-200-20 MG/5ML PO SUSP: 30.0000 mL | Freq: Four times a day (QID) | ORAL | Status: DC | PRN

## 2018-08-28 MED ORDER — MORPHINE SULFATE (PF) 4 MG/ML IV SOLN
4.0000 mg | Freq: Once | INTRAVENOUS | Status: AC
  Administered 2018-08-28: 4 mg via INTRAVENOUS
  Filled 2018-08-28: qty 1

## 2018-08-28 MED ORDER — ACETAMINOPHEN 650 MG RE SUPP: 650.0000 mg | Freq: Four times a day (QID) | RECTAL | Status: DC | PRN

## 2018-08-28 MED ORDER — GUAIFENESIN-DM 100-10 MG/5ML PO SYRP: 10.0000 mL | ORAL_SOLUTION | ORAL | Status: DC | PRN

## 2018-08-28 MED ORDER — HEPARIN (PORCINE) 25000 UT/250ML-% IV SOLN
1000.0000 [IU]/h | INTRAVENOUS | Status: DC
  Administered 2018-08-28: 1000 [IU]/h via INTRAVENOUS
  Filled 2018-08-28: qty 250

## 2018-08-28 MED ORDER — HYDROCORTISONE 2.5 % RE CREA
1.0000 "application " | TOPICAL_CREAM | Freq: Four times a day (QID) | RECTAL | Status: DC | PRN
  Filled 2018-08-28: qty 28.35

## 2018-08-28 MED ORDER — DIATRIZOATE MEGLUMINE & SODIUM 66-10 % PO SOLN
90.0000 mL | Freq: Once | ORAL | Status: AC
  Administered 2018-08-28: 90 mL via NASOGASTRIC
  Filled 2018-08-28: qty 90

## 2018-08-28 MED ORDER — HYDROCORTISONE 1 % EX CREA
1.0000 "application " | TOPICAL_CREAM | Freq: Three times a day (TID) | CUTANEOUS | Status: DC | PRN
  Filled 2018-08-28: qty 28

## 2018-08-28 NOTE — Consult Note (Addendum)
Tonya Stanley  November 02, 1945 [id number]  CARE TEAM:  PCP: Kathyrn Lass, MD  Outpatient Care Team: Patient Care Team: Kathyrn Lass, MD as PCP - General (Family Medicine) Octaviano Glow, MD as Consulting Physician (Medical Oncology) Altheimer, Legrand Como, MD as Consulting Physician (Endocrinology) Richmond Campbell, MD as Consulting Physician (Gastroenterology) Sampson Goon, MD as Consulting Physician (General Surgery) Jola Schmidt, MD as Consulting Physician (Gastroenterology)  Inpatient Treatment Team: Treatment Team: Attending Provider: Reyne Dumas, MD; Rounding Team: Suzan Garibaldi, MD; Technician: Velna Hatchet, [place]; Consulting Physician: Nolon Nations, MD; Registered Nurse: Campbell Lerner, RN; Case Manager: Dessa Phi, RN   This patient is a 73 y.o.female who presents today for surgical evaluation at the request of Dr Hal Hope.   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  Found to have sigmoid stricture in 2018.  Work-up not definitive for cancer but then had progression & it was more obvious.  Patient lives in [place] but was sent to [facility] and had treatment in [place].  Not seen by oncology or surgery in town here.  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  They stopped at diagnostic laparoscopy.  No resection done.  Patient claims this is the only surgery she is ever had in her abdomen.  Port-A-Cath placed.  On palliative chemotherapy by Dr. Octaviano Glow.  FOLFOX-based with Avastin.  Last dose of chemotherapy 08/15/2018.  Apparently not a candidate for HIPEC.   She had a history of crampy abdominal pain and was admitted in [facility] March 2019.  Concern for possible  obstruction but tended to have diarrhea.  Gastroenteritis suspected.  She quickly improved and was out of the hospital within 48 hours.  This was before the diagnosis of carcinomatosis 4 months later.  She has a history of intermittent atrial fibrillation.  Is on chronic anticoagulation.  History of pulmonary embolism in 2014.  Also left upper extremity DVT is what she claims.  Patient normally moves her bowels most days.  However had worsening severe abdominal pain last night.  Emesis.  None bloody.  Can emergency room.  CT scan concerning for small bowel obstruction with transition point in right lower quadrant.  Surgical consultation requested in the middle of the night.  Rehydrated and nasogastric tube placed.  This morning she notes her abdominal pain is much less.  No flatus or bowel movements.   Assessment  Tonya Stanley  73 y.o. 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  Nasogastric tube decompression.  Small bowel protocol.  Her pain has markedly decreased with the nasogastric tube decompression.  See if this can turn  around nonoperatively.  Transition zone seems to be in right lower quadrant.  Her rectosigmoid colon is in that region somewhat.  Seems to be more towards the anterior abdominal wall   Given the fact that she has only had diagnostic  laparoscopy and has known carcinomatosis, I suspect the transition zone is related to cancer and not adhesions.  If she does not improve or worsen, she may require operative exploration.  Her operative risks are markedly increased with her being on Avastin as the risk of leak rate and hernia is much higher with surgery <8 weeks, especially <4 weeks.  Likelihood of needing bowel resection increased as well.  If she has diffuse massive carcinomatosis, may need to just place palliative gastrostomy tube.  I do not get a sense that the patient wishes to be transferred back to [facility] if it comes to surgery, but we will see.  She does not have peritonitis and she is not in shock.  There is no evidence of any pneumatosis or perforation.  Would hold off on surgery.  She wishes to hold off as well.  Patient is anxious to reach out to her oncologist.  I believe medicine is doing that.  Consider consultation with medical oncology in town here as well.  -VTE prophylaxis- SCDs, etc -mobilize as tolerated to help recovery  45 minutes spent in review, evaluation, examination, counseling, and coordination of care.  More than 50% of that time was spent in counseling.  Adin Hector, MD, FACS, MASCRS Gastrointestinal and Minimally Invasive Surgery    [address] [place], [place] [address] [phone] Main / Paging [phone] Fax   08/28/2018      Past Medical History:  Diagnosis Date  . Liver lesion   . Paroxysmal atrial fibrillation ([place])    RFAblation 4/14 [facility]  . Pulmonary embolism ([place])    post RFCA atrial fib  [facility] 4/14    Past Surgical History:  Procedure Laterality Date  . ABLATION OF DYSRHYTHMIC FOCUS  11/13/2012  . COLONOSCOPY    . DIAGNOSTIC LAPAROSCOPY  01/2018   Peritoneal carcinomatosis - Biopsied  . TONSILLECTOMY      Social History   Socioeconomic History  . Marital status: Married    Spouse name: Not on file  . Number of children: Not on file  .  Years of education: Not on file  . Highest education level: Not on file  Occupational History  . Not on file  Social Needs  . Financial resource strain: Not on file  . Food insecurity:    Worry: Not on file    Inability: Not on file  . Transportation needs:    Medical: Not on file    Non-medical: Not on file  Tobacco Use  . Smoking status: Never Smoker  . Smokeless tobacco: Never Used  Substance and Sexual Activity  . Alcohol use: No  . Drug use: No  . Sexual activity: Never  Lifestyle  . Physical activity:    Days per week: Not on file    Minutes per session: Not on file  . Stress: Not on file  Relationships  . Social connections:    Talks on phone: Not on file    Gets together: Not on file    Attends religious service: Not on file    Active member of club or organization: Not on file    Attends meetings of clubs or organizations: Not on file    Relationship status: Not on  file  . Intimate partner violence:    Fear of current or ex partner: Not on file    Emotionally abused: Not on file    Physically abused: Not on file    Forced sexual activity: Not on file  Other Topics Concern  . Not on file  Social History Narrative  . Not on file    Family History  Problem Relation Age of Onset  . Hypertension Other     Current Facility-Administered Medications  Medication Dose Route Frequency Provider Last Rate Last Dose  . 0.9 %  sodium chloride infusion   Intravenous Continuous Rise Patience, MD 100 mL/hr at 08/28/18 0306    . acetaminophen (TYLENOL) tablet 650 mg  650 mg Oral Q6H PRN Rise Patience, MD       Or  . acetaminophen (TYLENOL) suppository 650 mg  650 mg Rectal Q6H PRN Rise Patience, MD      . alum & mag hydroxide-simeth (MAALOX/MYLANTA) 200-200-20 MG/5ML suspension 30 mL  30 mL Oral Q6H PRN Michael Boston, MD      . bisacodyl (DULCOLAX) suppository 10 mg  10 mg Rectal Daily Michael Boston, MD      . diphenhydrAMINE (BENADRYL) injection  12.5-25 mg  12.5-25 mg Intravenous Q6H PRN Michael Boston, MD      . guaiFENesin-dextromethorphan (ROBITUSSIN DM) 100-10 MG/5ML syrup 10 mL  10 mL Oral Q4H PRN Michael Boston, MD      . heparin ADULT infusion 100 units/mL (25000 units/250mL sodium chloride 0.45%)  1,000 Units/hr Intravenous Continuous Thomes Lolling, RPH 10 mL/hr at 08/28/18 0552 1,000 Units/hr at 08/28/18 0552  . hydrocortisone (ANUSOL-HC) 2.5 % rectal cream 1 application  1 application Topical QID PRN Michael Boston, MD      . hydrocortisone cream 1 % 1 application  1 application Topical TID PRN Michael Boston, MD      . iopamidol (ISOVUE-300) 61 % injection           . lactated ringers bolus 1,000 mL  1,000 mL Intravenous Q8H PRN Michael Boston, MD      . lip balm (CARMEX) ointment 1 application  1 application Topical BID Michael Boston, MD      . magic mouthwash  15 mL Oral QID PRN Michael Boston, MD      . menthol-cetylpyridinium (CEPACOL) lozenge 3 mg  1 lozenge Oral PRN Michael Boston, MD      . morphine 2 MG/ML injection 2 mg  2 mg Intravenous Q3H PRN Rise Patience, MD   2 mg at 08/28/18 0741  . phenol (CHLORASEPTIC) mouth spray 1-2 spray  1-2 spray Mouth/Throat PRN Michael Boston, MD      . promethazine (PHENERGAN) injection 12.5 mg  12.5 mg Intravenous Q4H PRN Rise Patience, MD   12.5 mg at 08/28/18 0307  . sodium chloride (PF) 0.9 % injection           . sodium chloride flush (NS) 0.9 % injection 10-40 mL  10-40 mL Intracatheter PRN Reyne Dumas, MD         Allergies  Allergen Reactions  . Erythromycin Nausea And Vomiting  . Macrobid [Nitrofurantoin Macrocrystal] Nausea And Vomiting    ROS:   All other systems reviewed & are negative except per HPI or as noted below: Constitutional:  No fevers, chills, sweats.  Weight stable Eyes:  No vision changes, No discharge HENT:  No sore throats, nasal drainage Lymph: No neck swelling, No bruising easily Pulmonary:  No  cough, productive sputum CV: No  orthopnea, PND  Patient walks 20 minutes for about 1/2 miles without difficulty.  No exertional chest/neck/shoulder/arm pain. GI:  No personal nor family history of inflammatory bowel disease, irritable bowel syndrome, allergy such as Celiac Sprue, dietary/dairy problems, colitis, ulcers nor gastritis.  No recent sick contacts/gastroenteritis.  No travel outside the country.  No changes in diet. Renal: No UTIs, No hematuria Genital:  No drainage, bleeding, masses Musculoskeletal: No severe joint pain.  Good ROM major joints Skin:  No sores or lesions.  No rashes Heme/Lymph:  No easy bleeding.  No swollen lymph nodes Neuro: No focal weakness/numbness.  No seizures Psych: No suicidal ideation.  No hallucinations  BP 137/79 (BP Location: Right Arm)   Pulse 65   Temp 98 F (36.7 C) (Oral)   Resp 16   Ht 5\' 7"  (1.702 m)   Wt 76.2 kg   SpO2 97%   BMI 26.31 kg/m   Physical Exam: General: Pt awake/alert/oriented x4 in no major acute distress Eyes: PERRL, normal EOM. Sclera nonicteric Neuro: CN II-XII intact w/o focal sensory/motor deficits. Lymph: No head/neck/groin lymphadenopathy Psych:  No delerium/psychosis/paranoia HENT: Normocephalic, Mucus membranes moist.  No thrush.  Nasogastric tube in place.  Thick green/brown effluent in canister Neck: Supple, No tracheal deviation Chest: No pain.  Good respiratory excursion. CV:  Pulses intact.  Regular rhythm  Abdomen: Obese but soft, Nondistended.  Nontender.  No incarcerated hernias.  Small supraumbilical incision consistent with diagnostic laparoscopy.  No hernia there.  No guarding.  No peritonitis.  Gen:  No inguinal hernias.  No inguinal lymphadenopathy.   Ext:  SCDs BLE.  No significant edema.  No cyanosis Skin: No petechiae / purpurea.  No major sores Musculoskeletal: No severe joint pain.  Good ROM major joints   Results:   Labs: Results for orders placed or performed during the hospital encounter of 08/27/18 (from the past  48 hour(s))  Lipase, blood     Status: None   Collection Time: 08/27/18 10:07 PM  Result Value Ref Range   Lipase 23 11 - 51 U/L    Comment: Performed at [facility], [place] [address] [place], [place] [address]  Comprehensive metabolic panel     Status: Abnormal   Collection Time: 08/27/18 10:07 PM  Result Value Ref Range   Sodium 138 135 - 145 mmol/L   Potassium 4.0 3.5 - 5.1 mmol/L   Chloride 103 98 - 111 mmol/L   CO2 24 22 - 32 mmol/L   Glucose, Bld 154 (H) 70 - 99 mg/dL   BUN 11 8 - 23 mg/dL   Creatinine, Ser 0.89 0.44 - 1.00 mg/dL   Calcium 9.6 8.9 - 10.3 mg/dL   Total Protein 7.2 6.5 - 8.1 g/dL   Albumin 4.2 3.5 - 5.0 g/dL   AST 20 15 - 41 U/L   ALT 15 0 - 44 U/L   Alkaline Phosphatase 54 38 - 126 U/L   Total Bilirubin 0.7 0.3 - 1.2 mg/dL   GFR calc non Af Amer >60 >60 mL/min   GFR calc Af Amer >60 >60 mL/min   Anion gap 11 5 - 15    Comment: Performed at [facility], [place] [address] [place], [place] [address]  CBC     Status: Abnormal   Collection Time: 08/27/18 10:07 PM  Result Value Ref Range   WBC 12.2 (H) 4.0 - 10.5 K/uL   RBC 4.57 3.87 - 5.11 MIL/uL  Hemoglobin 13.5 12.0 - 15.0 g/dL   HCT 42.7 36.0 - 46.0 %   MCV 93.4 80.0 - 100.0 fL   MCH 29.5 26.0 - 34.0 pg   MCHC 31.6 30.0 - 36.0 g/dL   RDW 14.3 11.5 - 15.5 %   Platelets 296 150 - 400 K/uL   nRBC 0.0 0.0 - 0.2 %    Comment: Performed at [facility], [place] [address], [place], [place] [address]  Basic metabolic panel     Status: Abnormal   Collection Time: 08/28/18  6:04 AM  Result Value Ref Range   Sodium 138 135 - 145 mmol/L   Potassium 4.3 3.5 - 5.1 mmol/L   Chloride 107 98 - 111 mmol/L   CO2 27 22 - 32 mmol/L   Glucose, Bld 111 (H) 70 - 99 mg/dL   BUN 9 8 - 23 mg/dL   Creatinine, Ser 0.75 0.44 - 1.00 mg/dL   Calcium 8.7 (L) 8.9 - 10.3 mg/dL   GFR calc non Af Amer >60 >60 mL/min   GFR calc Af Amer >60 >60 mL/min   Anion gap 4 (L) 5 - 15     Comment: Performed at [facility], [place] [address], [place], [place] [address]  Hepatic function panel     Status: Abnormal   Collection Time: 08/28/18  6:04 AM  Result Value Ref Range   Total Protein 6.0 (L) 6.5 - 8.1 g/dL   Albumin 3.3 (L) 3.5 - 5.0 g/dL   AST 16 15 - 41 U/L   ALT 12 0 - 44 U/L   Alkaline Phosphatase 44 38 - 126 U/L   Total Bilirubin 0.4 0.3 - 1.2 mg/dL   Bilirubin, Direct 0.1 0.0 - 0.2 mg/dL   Indirect Bilirubin 0.3 0.3 - 0.9 mg/dL    Comment: Performed at [facility], [place] [address], [place], [place] [address]  CBC WITH DIFFERENTIAL     Status: Abnormal   Collection Time: 08/28/18  6:04 AM  Result Value Ref Range   WBC 7.2 4.0 - 10.5 K/uL   RBC 3.93 3.87 - 5.11 MIL/uL   Hemoglobin 11.7 (L) 12.0 - 15.0 g/dL   HCT 38.1 36.0 - 46.0 %   MCV 96.9 80.0 - 100.0 fL   MCH 29.8 26.0 - 34.0 pg   MCHC 30.7 30.0 - 36.0 g/dL   RDW 14.3 11.5 - 15.5 %   Platelets 226 150 - 400 K/uL   nRBC 0.0 0.0 - 0.2 %   Neutrophils Relative % 71 %   Neutro Abs 5.1 1.7 - 7.7 K/uL   Lymphocytes Relative 21 %   Lymphs Abs 1.5 0.7 - 4.0 K/uL   Monocytes Relative 8 %   Monocytes Absolute 0.6 0.1 - 1.0 K/uL   Eosinophils Relative 0 %   Eosinophils Absolute 0.0 0.0 - 0.5 K/uL   Basophils Relative 0 %   Basophils Absolute 0.0 0.0 - 0.1 K/uL   Immature Granulocytes 0 %   Abs Immature Granulocytes 0.03 0.00 - 0.07 K/uL    Comment: Performed at [facility], [place] [address] [place], [place] [address]  Heparin level (unfractionated)     Status: None   Collection Time: 08/28/18  6:04 AM  Result Value Ref Range   Heparin Unfractionated 0.56 0.30 - 0.70 IU/mL    Comment: (NOTE) If heparin results are below expected values, and patient dosage has  been confirmed, suggest follow up testing of antithrombin III levels. Performed  at [facility], [place] [address], [place], [place] [address]   APTT     Status: None    Collection Time: 08/28/18  6:04 AM  Result Value Ref Range   aPTT 29 24 - 36 seconds    Comment: Performed at [facility], [place] [address] [place], [place] [address]  Glucose, capillary     Status: None   Collection Time: 08/28/18  6:17 AM  Result Value Ref Range   Glucose-Capillary 95 70 - 99 mg/dL    Imaging / Studies: Dg Abdomen 1 View  Result Date: 08/28/2018 CLINICAL DATA:  Nasogastric tube placement EXAM: ABDOMEN - 1 VIEW COMPARISON:  CT abdomen 08/27/2018 FINDINGS: Nasogastric tube with the tip projecting over the fundus of the stomach. There is relative paucity of bowel gas. There is no evidence of pneumoperitoneum, portal venous gas or pneumatosis. There are no pathologic calcifications along the expected course of the ureters. There is excreted contrast in the renal collecting system bilaterally. The osseous structures are unremarkable. IMPRESSION: Nasogastric tube with the tip projecting over the fundus of the stomach. Electronically Signed   By: Kathreen Devoid   On: 08/28/2018 01:42   Ct Abdomen Pelvis W Contrast  Result Date: 08/28/2018 CLINICAL DATA:  Abdominal pain, nausea, vomiting EXAM: CT ABDOMEN AND PELVIS WITH CONTRAST TECHNIQUE: Multidetector CT imaging of the abdomen and pelvis was performed using the standard protocol following bolus administration of intravenous contrast. CONTRAST:  180mL ISOVUE-300 IOPAMIDOL (ISOVUE-300) INJECTION 61% COMPARISON:  09/30/2017 FINDINGS: Lower chest: No acute abnormality. Hepatobiliary: Multiple hypodense, fluid attenuating hepatic masses with the largest in the left hepatic lobe measuring 5 x 4.1 cm consistent with cysts. No solid hepatic mass. No intrahepatic or extrahepatic biliary ductal dilatation. Normal gallbladder. Pancreas: Unremarkable. No pancreatic ductal dilatation or surrounding inflammatory changes. Spleen: Normal in size without focal abnormality. Adrenals/Urinary Tract: Adrenal glands are unremarkable. 10 mm  hypodense, fluid attenuating right renal mass most consistent with a small cyst. Kidneys are otherwise normal, without renal calculi, focal lesion, or hydronephrosis. Bladder is unremarkable. Stomach/Bowel: Stomach is within normal limits. Multiple dilated loops of small bowel measuring up to 3 cm. Multiple fluid-filled loops of small bowel with multiple air-fluid levels. Relative transition point in the right lower quadrant. Bowel appears to be tethered in the right lower quadrant suggesting adhesions. No pneumatosis, pneumoperitoneum or portal venous gas. Vascular/Lymphatic: Normal caliber abdominal aorta with atherosclerosis. No lymphadenopathy. Reproductive: Uterus and bilateral adnexa are unremarkable. Other: No abdominal wall hernia or abnormality. Small amount of pelvic free fluid. Musculoskeletal: No acute osseous abnormality. No aggressive osseous lesion. Mild osteoarthritis of bilateral SI joints. 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 Bilateral facet arthropathy at L3-4. IMPRESSION: 1. Multiple dilated loops of small bowel with air-fluid levels with a relative transition point in the right lower quadrant most concerning for a small bowel obstruction likely secondary to adhesions. 2.  Aortic Atherosclerosis (ICD10-I70.0). 3. Multiple liver cysts. Electronically Signed   By: Kathreen Devoid   On: 08/28/2018 00:07    Medications / Allergies: per chart  Antibiotics: Anti-infectives (From admission, onward)   None        Note: Portions of this report may have been transcribed using voice recognition software. Every effort was made to ensure accuracy; however, inadvertent computerized transcription errors may be present.   Any transcriptional errors that result from this process are unintentional.    Adin Hector, MD, FACS, MASCRS  Gastrointestinal and Minimally Invasive Surgery    [address] [place], [place]  [address] [phone] Main / Paging [phone] Fax   08/28/2018

## 2018-08-28 NOTE — H&P (Signed)
History and Physical    Tonya Stanley CBJ:[id number] DOB: [birth year] DOA: 08/27/2018  PCP: Kathyrn Lass, MD  Patient coming from: Home.  Chief Complaint: Abdominal pain nausea vomiting.  HPI: Tonya Stanley is a 73 y.o. female with history of colon cancer being treated at [facility] last chemo was 2 weeks ago presents to the ER with sudden onset of abdominal pain last evening around 6 PM.  1 hour prior to the abdominal pain patient had a regular bowel movement.  Following the abdominal pain patient started having multiple episodes of vomiting.  Pain was excruciating and patient decided to come to the ER.  Denies any blood in the vomitus.  ED Course: In the ER CAT scan shows bowel obstruction with transition point in the right lower quadrant.  Dr. Marlou Starks on-call general surgeon was consulted and patient was placed NG tube and will be seeing patient in consult.  Review of Systems: As per HPI, rest all negative.   Past Medical History:  Diagnosis Date  . Liver lesion   . Paroxysmal atrial fibrillation ([place])    RFAblation 4/14 [facility]  . Pulmonary embolism ([place])    post RFCA atrial fib  [facility] 4/14    Past Surgical History:  Procedure Laterality Date  . ABLATION OF DYSRHYTHMIC FOCUS  11/13/2012  . COLONOSCOPY    . TONSILLECTOMY       reports that she has never smoked. She has never used smokeless tobacco. She reports that she does not drink alcohol or use drugs.  Allergies  Allergen Reactions  . Erythromycin Nausea And Vomiting  . Macrobid [Nitrofurantoin Macrocrystal] Nausea And Vomiting    Family History  Problem Relation Age of Onset  . Hypertension Other     Prior to Admission medications   Medication Sig Start Date End Date Taking? Authorizing Provider  Biotin 1000 MCG tablet Take 5,000 mcg by mouth daily.     [provider]  Cholecalciferol (VITAMIN D3) 1000 units CAPS Take 4,000 Units by mouth daily.    [provider]  MAGNESIUM PO Take 1 capsule by  mouth daily.    [provider]  Omega-3 Fatty Acids (FISH OIL) 1000 MG CAPS Take 1,000 mg by mouth daily.    [provider]  pantoprazole (PROTONIX) 40 MG tablet Take by mouth. 04/03/18 04/03/19  [provider]    Physical Exam: Vitals:   08/28/18 0100 08/28/18 0115 08/28/18 0130 08/28/18 0209  BP: 114/60 135/79 131/70 128/73  Pulse: 82 78 78 75  Resp: 19 15 15 18   Temp:    (!) 97.5 F (36.4 C)  TempSrc:    Oral  SpO2: 95% 90% 92% 93%      Constitutional: Moderately built and nourished. Vitals:   08/28/18 0100 08/28/18 0115 08/28/18 0130 08/28/18 0209  BP: 114/60 135/79 131/70 128/73  Pulse: 82 78 78 75  Resp: 19 15 15 18   Temp:    (!) 97.5 F (36.4 C)  TempSrc:    Oral  SpO2: 95% 90% 92% 93%   Eyes: Anicteric no pallor. ENMT: No discharge from the ears eyes nose or mouth. Neck: No mass felt.  No neck rigidity. Respiratory: No rhonchi or crepitations. Cardiovascular: S1-S2 heard. Abdomen: Mildly distended bowel sounds not appreciated.  No guarding or rigidity. Musculoskeletal: No edema.  No joint effusion. Skin: No rash. Neurologic: Alert awake oriented to time place and person.  Moves all extremities. Psychiatric: Appears normal.  Normal affect.   Labs on Admission:  I have personally reviewed following labs and imaging studies  CBC: Recent Labs  Lab 08/27/18 2207  WBC 12.2*  HGB 13.5  HCT 42.7  MCV 93.4  PLT 144   Basic Metabolic Panel: Recent Labs  Lab 08/27/18 2207  NA 138  K 4.0  CL 103  CO2 24  GLUCOSE 154*  BUN 11  CREATININE 0.89  CALCIUM 9.6   GFR: CrCl cannot be calculated (Unknown ideal weight.). Liver Function Tests: Recent Labs  Lab 08/27/18 2207  AST 20  ALT 15  ALKPHOS 54  BILITOT 0.7  PROT 7.2  ALBUMIN 4.2   Recent Labs  Lab 08/27/18 2207  LIPASE 23   No results for input(s): AMMONIA in the last 168 hours. Coagulation Profile: No results for input(s): INR, PROTIME in the last 168  hours. Cardiac Enzymes: No results for input(s): CKTOTAL, CKMB, CKMBINDEX, TROPONINI in the last 168 hours. BNP (last 3 results) No results for input(s): PROBNP in the last 8760 hours. HbA1C: No results for input(s): HGBA1C in the last 72 hours. CBG: No results for input(s): GLUCAP in the last 168 hours. Lipid Profile: No results for input(s): CHOL, HDL, LDLCALC, TRIG, CHOLHDL, LDLDIRECT in the last 72 hours. Thyroid Function Tests: No results for input(s): TSH, T4TOTAL, FREET4, T3FREE, THYROIDAB in the last 72 hours. Anemia Panel: No results for input(s): VITAMINB12, FOLATE, FERRITIN, TIBC, IRON, RETICCTPCT in the last 72 hours. Urine analysis:    Component Value Date/Time   COLORURINE YELLOW 09/30/2017 2247   APPEARANCEUR CLEAR 09/30/2017 2247   LABSPEC 1.040 (H) 09/30/2017 2247   PHURINE 5.0 09/30/2017 2247   GLUCOSEU NEGATIVE 09/30/2017 2247   HGBUR NEGATIVE 09/30/2017 2247   BILIRUBINUR NEGATIVE 09/30/2017 2247   KETONESUR NEGATIVE 09/30/2017 2247   PROTEINUR NEGATIVE 09/30/2017 2247   UROBILINOGEN 1.0 11/17/2012 1147   NITRITE NEGATIVE 09/30/2017 2247   LEUKOCYTESUR NEGATIVE 09/30/2017 2247   Sepsis Labs: @LABRCNTIP (procalcitonin:4,lacticidven:4) )No results found for this or any previous visit (from the past 240 hour(s)).   Radiological Exams on Admission: Dg Abdomen 1 View  Result Date: 08/28/2018 CLINICAL DATA:  Nasogastric tube placement EXAM: ABDOMEN - 1 VIEW COMPARISON:  CT abdomen 08/27/2018 FINDINGS: Nasogastric tube with the tip projecting over the fundus of the stomach. There is relative paucity of bowel gas. There is no evidence of pneumoperitoneum, portal venous gas or pneumatosis. There are no pathologic calcifications along the expected course of the ureters. There is excreted contrast in the renal collecting system bilaterally. The osseous structures are unremarkable. IMPRESSION: Nasogastric tube with the tip projecting over the fundus of the stomach.  Electronically Signed   By: Kathreen Devoid   On: 08/28/2018 01:42   Ct Abdomen Pelvis W Contrast  Result Date: 08/28/2018 CLINICAL DATA:  Abdominal pain, nausea, vomiting EXAM: CT ABDOMEN AND PELVIS WITH CONTRAST TECHNIQUE: Multidetector CT imaging of the abdomen and pelvis was performed using the standard protocol following bolus administration of intravenous contrast. CONTRAST:  171mL ISOVUE-300 IOPAMIDOL (ISOVUE-300) INJECTION 61% COMPARISON:  09/30/2017 FINDINGS: Lower chest: No acute abnormality. Hepatobiliary: Multiple hypodense, fluid attenuating hepatic masses with the largest in the left hepatic lobe measuring 5 x 4.1 cm consistent with cysts. No solid hepatic mass. No intrahepatic or extrahepatic biliary ductal dilatation. Normal gallbladder. Pancreas: Unremarkable. No pancreatic ductal dilatation or surrounding inflammatory changes. Spleen: Normal in size without focal abnormality. Adrenals/Urinary Tract: Adrenal glands are unremarkable. 10 mm hypodense, fluid attenuating right renal mass most consistent with a small cyst. Kidneys are otherwise normal, without renal  calculi, focal lesion, or hydronephrosis. Bladder is unremarkable. Stomach/Bowel: Stomach is within normal limits. Multiple dilated loops of small bowel measuring up to 3 cm. Multiple fluid-filled loops of small bowel with multiple air-fluid levels. Relative transition point in the right lower quadrant. Bowel appears to be tethered in the right lower quadrant suggesting adhesions. No pneumatosis, pneumoperitoneum or portal venous gas. Vascular/Lymphatic: Normal caliber abdominal aorta with atherosclerosis. No lymphadenopathy. Reproductive: Uterus and bilateral adnexa are unremarkable. Other: No abdominal wall hernia or abnormality. Small amount of pelvic free fluid. Musculoskeletal: No acute osseous abnormality. No aggressive osseous lesion. Mild osteoarthritis of bilateral SI joints. 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 Bilateral facet arthropathy at L3-4. IMPRESSION: 1. Multiple dilated loops of small bowel with air-fluid levels with a relative transition point in the right lower quadrant most concerning for a small bowel obstruction likely secondary to adhesions. 2.  Aortic Atherosclerosis (ICD10-I70.0). 3. Multiple liver cysts. Electronically Signed   By: Kathreen Devoid   On: 08/28/2018 00:07     Assessment/Plan Principal Problem:   SBO (small bowel obstruction) ([place]) Active Problems:   H/o Atrial fibrillation (HCC)   DVT (deep venous thrombosis) ([place])    1. Small bowel obstruction -general surgeon Dr. Marlou Starks has been consulted.  Patient placed on NG tube.  N.p.o. pain only medication IV fluids.  Repeat KUB in a.m.  Further recommendations per general surgery. 2. 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  Last dose was yesterday morning.  In view of possible procedure for the small bowel obstruction will keep patient on heparin for now. 3. Colon cancer being followed by oncologist Dr. Manuella Ghazi at [facility].  Last chemo was about 2 weeks ago. 4. History of atrial fibrillation status post ablation. 5. History of subclinical thyroid disorder being followed by endocrinologist.  Not on any medication at this time.   DVT prophylaxis: Heparin infusion. Code Status: Full code. Family Communication: Discussed with patient. Disposition Plan: Home. Consults called: General surgery. Admission status: Inpatient.   Rise Patience MD [facility] Pager [phone].  If 7PM-7AM, please contact night-coverage [email or web address] Password [email or web address]  08/28/2018, 2:57 AM

## 2018-08-28 NOTE — Progress Notes (Signed)
Patient seen and examined  73 y.o. female with history of colon cancer being treated at [facility] last chemo was 2 weeks ago presents to the ER with sudden onset of abdominal pain last evening around 6 PM.  1 hour prior to the abdominal pain patient had a regular bowel movement.  Following the abdominal pain patient started having multiple episodes of vomiting.  Pain was excruciating and patient decided to come to the ER. In the ER CAT scan shows bowel obstruction with transition point in the right lower quadrant.  Dr. Marlou Starks on-call general surgeon was consulted and patient was placed NG tube and will be seeing patient in consult.  Assessment and plan  1. Small bowel obstruction -general surgeon Dr. Johney Maine has been consulted.  Patient placed on NG tube.  N.p.o. pain only medication IV fluids.    Further recommendations per general surgery. Small bowel follow through today 2. 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  Last dose was yesterday morning.  In view of possible procedure for the small bowel obstruction will keep patient on heparin for now. 3. Colon cancer being followed by oncologist Dr. Manuella Ghazi at [facility].  Last chemo was about 2 weeks ago. 1/21 . Patient is currently receiving Avastin,fluorouracil,oxaliplatin, next last cycle was 08/29/18 as per Dr. Octaviano Glow 4. History of atrial fibrillation status post ablation. 5. History of subclinical thyroid disorder being followed by endocrinologist.  Not on any medication at this time.ssessment and plan

## 2018-08-28 NOTE — Progress Notes (Signed)
ANTICOAGULATION CONSULT NOTE - Follow Up Consult  Pharmacy Consult for Heparin Indication: hx VTE + PAF-->bridge while NPO, off Eliquis  Allergies  Allergen Reactions  . Erythromycin Nausea And Vomiting  . Macrobid [Nitrofurantoin Macrocrystal] Nausea And Vomiting    Patient Measurements:Height: 5\' 7"  (170.2 cm) Weight: 168 lb (76.2 kg) IBW/kg (Calculated) : 61.6 HEPARIN DW (KG): 76.2   Vital Signs: Temp: 97.5 F (36.4 C) (02/03 2053) Temp Source: Oral (02/03 2053) BP: 146/73 (02/03 2053) Pulse Rate: 76 (02/03 2053)  Labs: Recent Labs    08/27/18 2207 08/28/18 0604 08/28/18 1331 08/28/18 2214  HGB 13.5 11.7*  --   --   HCT 42.7 38.1  --   --   PLT 296 226  --   --   APTT  --  29 78* 103*  HEPARINUNFRC  --  0.56 0.92*  --   CREATININE 0.89 0.75  --   --     Estimated Creatinine Clearance: 69.6 mL/min (by C-G formula based on SCr of 0.75 mg/dL).   Medical History: Past Medical History:  Diagnosis Date  . Benign liver cysts 08/28/2018  . Cancer of sigmoid colon ([place]) 08/28/2018  . History of adenomatous polyp of colon 07/15/2017  . Liver lesion   . Paroxysmal atrial fibrillation ([place])    [facility]blation 4/14 [facility]  . 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 Pulmonary embolism ([place])    post RFCA atrial fib  [facility] 4/14    Medications:  Infusions:  . sodium chloride 100 mL/hr at 08/28/18 2237  . heparin    . lactated ringers     Eliquis PTA- last dose 2/2 @ 0600  Assessment: 73 yo F who takes Eliquis PTA for hx DVT/PE.  She also has hx PAF.  She is admitted with SBO.  Pharmacy is asked to dose heparin while patient is NPO.    Today, 08/28/2018  PTT 78 sec (therapeutic)  Heparin level 0.92 (elevated but may be showing some residual effects from Eliquis)  CBC: Hgb decreased slightly, Plts wnl  No bleeding reported  SCr 0.75, CrCl ~70 ml/min  2214 aPtt = 103 sec slightly above goal, no  infusion or bleeding issues per RN.   Goal of Therapy:  APTT 66-102 seconds Heparin level 0.3-0.7 Monitor platelets by anticoagulation protocol: Yes   Plan:  Decrease heparin drip to 950 units/hr Recheck aptt in 8 hours Daily heparin level & CBC while on heparin  Dorrene German 08/28/2018,10:48 PM

## 2018-08-28 NOTE — Progress Notes (Signed)
ANTICOAGULATION CONSULT NOTE - Initial Consult  Pharmacy Consult for Heparin Indication: hx VTE + PAF-->bridge while NPO, off Eliquis  Allergies  Allergen Reactions  . Erythromycin Nausea And Vomiting  . Macrobid [Nitrofurantoin Macrocrystal] Nausea And Vomiting    Patient Measurements:  HEPARIN DW (KG): 76.2   Vital Signs: Temp: 97.5 F (36.4 C) (02/03 0209) Temp Source: Oral (02/03 0209) BP: 128/73 (02/03 0209) Pulse Rate: 75 (02/03 0209)  Labs: Recent Labs    08/27/18 2207  HGB 13.5  HCT 42.7  PLT 296  CREATININE 0.89    CrCl cannot be calculated (Unknown ideal weight.).   Medical History: Past Medical History:  Diagnosis Date  . Liver lesion   . Paroxysmal atrial fibrillation ([place])    [facility]blation 4/14 [facility]  . Pulmonary embolism ([place])    post RFCA atrial fib  [facility] 4/14    Medications:  Infusions:  . sodium chloride 100 mL/hr at 08/28/18 0306   Eliquis PTA- last dose 2/2 @ 0600  Assessment: 73 yo F who takes Eliquis PTA for hx DVT/PE.  She also has hx PAF.  She is admitted with SBO.  Pharmacy is asked to dose heparin while patient is NPO.   Baseline heparin level & aPTT pending.   CBC WNL.  No bleeding noted.   Goal of Therapy:  APTT 66-102 seconds Heparin level 0.3-0.7 Monitor platelets by anticoagulation protocol: Yes   Plan:  Start Heparin infusion at 1000 units/hr Check 8hr aPTT & heparin level Daily heparin level & CBC while on heparin  Biagio Borg 08/28/2018,3:04 AM

## 2018-08-28 NOTE — Progress Notes (Signed)
ANTICOAGULATION CONSULT NOTE - Follow Up Consult  Pharmacy Consult for Heparin Indication: hx VTE + PAF-->bridge while NPO, off Eliquis  Allergies  Allergen Reactions  . Erythromycin Nausea And Vomiting  . Macrobid [Nitrofurantoin Macrocrystal] Nausea And Vomiting    Patient Measurements:Height: 5\' 7"  (170.2 cm) Weight: 168 lb (76.2 kg) IBW/kg (Calculated) : 61.6 HEPARIN DW (KG): 76.2   Vital Signs: Temp: 98 F (36.7 C) (02/03 0613) Temp Source: Oral (02/03 0613) BP: 137/79 (02/03 2549) Pulse Rate: 65 (02/03 0613)  Labs: Recent Labs    08/27/18 2207 08/28/18 0604 08/28/18 1331  HGB 13.5 11.7*  --   HCT 42.7 38.1  --   PLT 296 226  --   APTT  --  29 78*  HEPARINUNFRC  --  0.56 0.92*  CREATININE 0.89 0.75  --     Estimated Creatinine Clearance: 69.6 mL/min (by C-G formula based on SCr of 0.75 mg/dL).   Medical History: Past Medical History:  Diagnosis Date  . Benign liver cysts 08/28/2018  . Cancer of sigmoid colon ([place]) 08/28/2018  . History of adenomatous polyp of colon 07/15/2017  . Liver lesion   . Paroxysmal atrial fibrillation ([place])    [facility]  . 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 Pulmonary embolism ([place])    post RFCA atrial fib  [facility] 4/14    Medications:  Infusions:  . sodium chloride 100 mL/hr at 08/28/18 0306  . heparin 1,000 Units/hr (08/28/18 0552)  . lactated ringers     Eliquis PTA- last dose 2/2 @ 0600  Assessment: 73 yo F who takes Eliquis PTA for hx DVT/PE.  She also has hx PAF.  She is admitted with SBO.  Pharmacy is asked to dose heparin while patient is NPO.    Today, 08/28/2018  PTT 78 sec (therapeutic)  Heparin level 0.92 (elevated but may be showing some residual effects from Eliquis)  CBC: Hgb decreased slightly, Plts wnl  No bleeding reported  SCr 0.75, CrCl ~70 ml/min  Goal of Therapy:  APTT 66-102 seconds Heparin level  0.3-0.7 Monitor platelets by anticoagulation protocol: Yes   Plan:  Continue Heparin infusion at 1000 units/hr Check 8hr aPTT to confirm therapeutic Daily heparin level & CBC while on heparin  Peggyann Juba, PharmD, BCPS Pager: [phone] 08/28/2018,2:10 PM

## 2018-08-28 NOTE — ED Notes (Signed)
ED TO INPATIENT HANDOFF REPORT  Name/Age/Gender Tonya Stanley 73 y.o. female  Code Status Code Status History    Date Active Date Inactive Code Status Order ID Comments User Context   10/01/2017 0414 10/02/2017 1753 Full Code [id number]  Rise Patience, MD Inpatient      Home/SNF/Other Home  Chief Complaint Abdominal Pain  Level of Care/Admitting Diagnosis ED Disposition    ED Disposition Condition [place] [facility] Area: [facility] [[id number]]  Level of Care: Telemetry [5]  Admit to tele based on following criteria: Monitor for Ischemic changes  Diagnosis: SBO (small bowel obstruction) [facility]) [[id number]]  Admitting Physician: Rise Patience [id number]  Attending Physician: Rise Patience [phone]  Estimated length of stay: past midnight tomorrow  Certification:: I certify this patient will need inpatient services for at least 2 midnights  PT Class (Do Not Modify): Inpatient [101]  PT Acc Code (Do Not Modify): Private [1]       Medical History Past Medical History:  Diagnosis Date  . Liver lesion   . Paroxysmal atrial fibrillation ([place])    [facility]blation 4/14 [facility]  . Pulmonary embolism ([place])    post RFCA atrial fib  [facility] 4/14    Allergies Allergies  Allergen Reactions  . Erythromycin Nausea And Vomiting  . Macrobid [Nitrofurantoin Macrocrystal] Nausea And Vomiting    IV Location/Drains/Wounds Patient Lines/Drains/Airways Status   Active Line/Drains/Airways    Name:   Placement date:   Placement time:   Site:   Days:   Implanted Port 08/27/18   08/27/18    2222    -   1   NG/OG Tube Nasogastric 16 Fr. Left nare Xray Documented cm marking at nare/ corner of mouth   08/28/18    0103    Left nare   less than 1          Labs/Imaging Results for orders placed or performed during the hospital encounter of 08/27/18 (from the past 48 hour(s))  Lipase, blood     Status: None   Collection Time: 08/27/18 10:07 PM  Result Value Ref Range    Lipase 23 11 - 51 U/L    Comment: Performed at [facility], [place] [address], [place], [place] [address]  Comprehensive metabolic panel     Status: Abnormal   Collection Time: 08/27/18 10:07 PM  Result Value Ref Range   Sodium 138 135 - 145 mmol/L   Potassium 4.0 3.5 - 5.1 mmol/L   Chloride 103 98 - 111 mmol/L   CO2 24 22 - 32 mmol/L   Glucose, Bld 154 (H) 70 - 99 mg/dL   BUN 11 8 - 23 mg/dL   Creatinine, Ser 0.89 0.44 - 1.00 mg/dL   Calcium 9.6 8.9 - 10.3 mg/dL   Total Protein 7.2 6.5 - 8.1 g/dL   Albumin 4.2 3.5 - 5.0 g/dL   AST 20 15 - 41 U/L   ALT 15 0 - 44 U/L   Alkaline Phosphatase 54 38 - 126 U/L   Total Bilirubin 0.7 0.3 - 1.2 mg/dL   GFR calc non Af Amer >60 >60 mL/min   GFR calc Af Amer >60 >60 mL/min   Anion gap 11 5 - 15    Comment: Performed at [facility], [place] [address], [place], [place] [address]  CBC     Status: Abnormal   Collection Time: 08/27/18 10:07 PM  Result Value Ref Range   WBC 12.2 (H)  4.0 - 10.5 K/uL   RBC 4.57 3.87 - 5.11 MIL/uL   Hemoglobin 13.5 12.0 - 15.0 g/dL   HCT 42.7 36.0 - 46.0 %   MCV 93.4 80.0 - 100.0 fL   MCH 29.5 26.0 - 34.0 pg   MCHC 31.6 30.0 - 36.0 g/dL   RDW 14.3 11.5 - 15.5 %   Platelets 296 150 - 400 K/uL   nRBC 0.0 0.0 - 0.2 %    Comment: Performed at [facility], [place] [address] [place], [place] [address]   Ct Abdomen Pelvis W Contrast  Result Date: 08/28/2018 CLINICAL DATA:  Abdominal pain, nausea, vomiting EXAM: CT ABDOMEN AND PELVIS WITH CONTRAST TECHNIQUE: Multidetector CT imaging of the abdomen and pelvis was performed using the standard protocol following bolus administration of intravenous contrast. CONTRAST:  176mL ISOVUE-300 IOPAMIDOL (ISOVUE-300) INJECTION 61% COMPARISON:  09/30/2017 FINDINGS: Lower chest: No acute abnormality. Hepatobiliary: Multiple hypodense, fluid attenuating hepatic masses with the largest in the left hepatic lobe measuring 5 x 4.1 cm  consistent with cysts. No solid hepatic mass. No intrahepatic or extrahepatic biliary ductal dilatation. Normal gallbladder. Pancreas: Unremarkable. No pancreatic ductal dilatation or surrounding inflammatory changes. Spleen: Normal in size without focal abnormality. Adrenals/Urinary Tract: Adrenal glands are unremarkable. 10 mm hypodense, fluid attenuating right renal mass most consistent with a small cyst. Kidneys are otherwise normal, without renal calculi, focal lesion, or hydronephrosis. Bladder is unremarkable. Stomach/Bowel: Stomach is within normal limits. Multiple dilated loops of small bowel measuring up to 3 cm. Multiple fluid-filled loops of small bowel with multiple air-fluid levels. Relative transition point in the right lower quadrant. Bowel appears to be tethered in the right lower quadrant suggesting adhesions. No pneumatosis, pneumoperitoneum or portal venous gas. Vascular/Lymphatic: Normal caliber abdominal aorta with atherosclerosis. No lymphadenopathy. Reproductive: Uterus and bilateral adnexa are unremarkable. Other: No abdominal wall hernia or abnormality. Small amount of pelvic free fluid. Musculoskeletal: No acute osseous abnormality. No aggressive osseous lesion. Mild osteoarthritis of bilateral SI joints. 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 Bilateral facet arthropathy at L3-4. IMPRESSION: 1. Multiple dilated loops of small bowel with air-fluid levels with a relative transition point in the right lower quadrant most concerning for a small bowel obstruction likely secondary to adhesions. 2.  Aortic Atherosclerosis (ICD10-I70.0). 3. Multiple liver cysts. Electronically Signed   By: Kathreen Devoid   On: 08/28/2018 00:07   None  Pending Labs Unresulted Labs (From admission, onward)    Start     Ordered   08/27/18 2148  Urinalysis, Routine w reflex microscopic  ONCE - STAT,   STAT     08/27/18 2148           Vitals/Pain Today's Vitals   08/28/18 0100 08/28/18 0100 08/28/18 0115 08/28/18 0130  BP: 114/60  135/79 131/70  Pulse: 82  78 78  Resp: 19  15 15   Temp:      TempSrc:      SpO2: 95%  90% 92%  PainSc:  1       Isolation Precautions No active isolations  Medications Medications  iopamidol (ISOVUE-300) 61 % injection (has no administration in time range)  sodium chloride (PF) 0.9 % injection (has no administration in time range)  ondansetron (ZOFRAN) injection 4 mg (4 mg Intravenous Given 08/27/18 2219)  sodium chloride 0.9 % bolus 1,000 mL (0 mLs Intravenous Stopped 08/27/18 2359)  morphine 4 MG/ML injection 4 mg (4 mg Intravenous Given 08/27/18 2220)  iopamidol (ISOVUE-300) 61 % injection 100 mL (100 mLs Intravenous Contrast Given 08/27/18 2342)  morphine 4 MG/ML injection 4 mg (4 mg Intravenous Given 08/28/18 0017)  lidocaine (XYLOCAINE) 2 % jelly 1 application (1 application Topical Given 08/28/18 0059)    Mobility walks

## 2018-08-28 NOTE — Progress Notes (Signed)
Called by the patient's medical oncologist, Dr. Octaviano Glow, through [facility].  Past medical history discussed and current status updated.  If the patient desires, they are happy to see if they can have the patient transferred care over at [facility].  I get the sense that the patient is happy with care here so we will hold off.  Dr. Manuella Ghazi did some of his training through [facility], so he feels confident we can help manage her.  Just offering if the patient prefers that.  Continue small bowel protocol.  In the event that the patient does not open up in the next 48-72 hours, plan diagnostic laparoscopy and see if there is a straightforward problem that can be addressed such as lyse adhesions versus possible bowel resection.  Most likely due anterior gastropexy to left upper quadrant so that will be easier to do an endoscopic or radiographic gastrostomy tube placement in the future should carcinomatosis progress as statistically it is more likely to do.  Avoid another operation with an open G-tube if disease not severe, leave it at that.  If massive carcinomatosis and progression, a serious concern given the fact that she is been on palliative care therapy for over 6 months with concerns of little improvement, may benefit from gastrostomy tube placement as well and palliative care consult with hospice.  I do not think the patient is mentally ready for that just yet.   Hopefully the patient will open up nonoperatively and would not come that further down the algorithm just yet.  We will see.  Adin Hector, MD, FACS, MASCRS Gastrointestinal and Minimally Invasive Surgery    [address], [place],  [id number] [phone] Main / Paging [phone] Fax

## 2018-08-29 DIAGNOSIS — I4811 Longstanding persistent atrial fibrillation: Secondary | ICD-10-CM

## 2018-08-29 LAB — CBC
HCT: 37.4 % (ref 36.0–46.0)
Hemoglobin: 11.5 g/dL — ABNORMAL LOW (ref 12.0–15.0)
MCH: 29.6 pg (ref 26.0–34.0)
MCHC: 30.7 g/dL (ref 30.0–36.0)
MCV: 96.1 fL (ref 80.0–100.0)
Platelets: 210 10*3/uL (ref 150–400)
RBC: 3.89 MIL/uL (ref 3.87–5.11)
RDW: 14.1 % (ref 11.5–15.5)
WBC: 7.6 10*3/uL (ref 4.0–10.5)
nRBC: 0 % (ref 0.0–0.2)

## 2018-08-29 LAB — HEPARIN LEVEL (UNFRACTIONATED): Heparin Unfractionated: 0.83 IU/mL — ABNORMAL HIGH (ref 0.30–0.70)

## 2018-08-29 LAB — APTT: aPTT: 95 seconds — ABNORMAL HIGH (ref 24–36)

## 2018-08-29 LAB — GLUCOSE, CAPILLARY: Glucose-Capillary: 98 mg/dL (ref 70–99)

## 2018-08-29 MED ORDER — SODIUM CHLORIDE 0.9 % IV SOLN
INTRAVENOUS | Status: DC
  Administered 2018-08-29 (×2): via INTRAVENOUS

## 2018-08-29 NOTE — Progress Notes (Signed)
[facility] Progress Note     Subjective: CC-  States that she's still having some intermittent abdominal pain. Denies bloating. Some nausea, no emesis. She has had multiple loose BMs, passing some but not a lot of flatus.   Objective: Vital signs in last 24 hours: Temp:  [97.5 F (36.4 C)-98.2 F (36.8 C)] 97.6 F (36.4 C) (02/04 0459) Pulse Rate:  [74-82] 82 (02/04 0459) Resp:  [14-20] 17 (02/04 0459) BP: (142-171)/(69-85) 171/69 (02/04 0654) SpO2:  [93 %-97 %] 94 % (02/04 0459) Weight:  [76.7 kg] 76.7 kg (02/04 0459) Last BM Date: 08/28/18  Intake/Output from previous day: 02/03 0701 - 02/04 0700 In: 3996.4 [P.O.:264; I.V.:3012.4; NG/GT:720] Out: 1153 [Urine:1051; Emesis/NG output:100; Stool:2] Intake/Output this shift: No intake/output data recorded.  PE: Gen:  Alert, NAD, pleasant HEENT: EOM's intact, pupils equal and round Pulm:  effort normal Abd: Soft, ND, NT, +BS Psych: A&Ox3  Skin: warm and dry  Lab Results:  Recent Labs    08/28/18 0604 08/29/18 0806  WBC 7.2 7.6  HGB 11.7* 11.5*  HCT 38.1 37.4  PLT 226 210   BMET Recent Labs    08/27/18 2207 08/28/18 0604  NA 138 138  K 4.0 4.3  CL 103 107  CO2 24 27  GLUCOSE 154* 111*  BUN 11 9  CREATININE 0.89 0.75  CALCIUM 9.6 8.7*   PT/INR No results for input(s): LABPROT, INR in the last 72 hours. CMP     Component Value Date/Time   NA 138 08/28/2018 0604   K 4.3 08/28/2018 0604   CL 107 08/28/2018 0604   CO2 27 08/28/2018 0604   GLUCOSE 111 (H) 08/28/2018 0604   BUN 9 08/28/2018 0604   CREATININE 0.75 08/28/2018 0604   CALCIUM 8.7 (L) 08/28/2018 0604   PROT 6.0 (L) 08/28/2018 0604   ALBUMIN 3.3 (L) 08/28/2018 0604   AST 16 08/28/2018 0604   ALT 12 08/28/2018 0604   ALKPHOS 44 08/28/2018 0604   BILITOT 0.4 08/28/2018 0604   GFRNONAA >60 08/28/2018 0604   GFRAA >60 08/28/2018 0604   Lipase     Component Value Date/Time   LIPASE 23 08/27/2018 2207        Studies/Results: Dg Abdomen 1 View  Result Date: 08/28/2018 CLINICAL DATA:  Nasogastric tube placement EXAM: ABDOMEN - 1 VIEW COMPARISON:  CT abdomen 08/27/2018 FINDINGS: Nasogastric tube with the tip projecting over the fundus of the stomach. There is relative paucity of bowel gas. There is no evidence of pneumoperitoneum, portal venous gas or pneumatosis. There are no pathologic calcifications along the expected course of the ureters. There is excreted contrast in the renal collecting system bilaterally. The osseous structures are unremarkable. IMPRESSION: Nasogastric tube with the tip projecting over the fundus of the stomach. Electronically Signed   By: Kathreen Devoid   On: 08/28/2018 01:42   Ct Abdomen Pelvis W Contrast  Result Date: 08/28/2018 CLINICAL DATA:  Abdominal pain, nausea, vomiting EXAM: CT ABDOMEN AND PELVIS WITH CONTRAST TECHNIQUE: Multidetector CT imaging of the abdomen and pelvis was performed using the standard protocol following bolus administration of intravenous contrast. CONTRAST:  164mL ISOVUE-300 IOPAMIDOL (ISOVUE-300) INJECTION 61% COMPARISON:  09/30/2017 FINDINGS: Lower chest: No acute abnormality. Hepatobiliary: Multiple hypodense, fluid attenuating hepatic masses with the largest in the left hepatic lobe measuring 5 x 4.1 cm consistent with cysts. No solid hepatic mass. No intrahepatic or extrahepatic biliary ductal dilatation. Normal gallbladder. Pancreas: Unremarkable. No pancreatic ductal dilatation or surrounding inflammatory changes. Spleen: Normal  in size without focal abnormality. Adrenals/Urinary Tract: Adrenal glands are unremarkable. 10 mm hypodense, fluid attenuating right renal mass most consistent with a small cyst. Kidneys are otherwise normal, without renal calculi, focal lesion, or hydronephrosis. Bladder is unremarkable. Stomach/Bowel: Stomach is within normal limits. Multiple dilated loops of small bowel measuring up to 3 cm. Multiple fluid-filled loops  of small bowel with multiple air-fluid levels. Relative transition point in the right lower quadrant. Bowel appears to be tethered in the right lower quadrant suggesting adhesions. No pneumatosis, pneumoperitoneum or portal venous gas. Vascular/Lymphatic: Normal caliber abdominal aorta with atherosclerosis. No lymphadenopathy. Reproductive: Uterus and bilateral adnexa are unremarkable. Other: No abdominal wall hernia or abnormality. Small amount of pelvic free fluid. Musculoskeletal: No acute osseous abnormality. No aggressive osseous lesion. Mild osteoarthritis of bilateral SI joints. 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 Bilateral facet arthropathy at L3-4. IMPRESSION: 1. Multiple dilated loops of small bowel with air-fluid levels with a relative transition point in the right lower quadrant most concerning for a small bowel obstruction likely secondary to adhesions. 2.  Aortic Atherosclerosis (ICD10-I70.0). 3. Multiple liver cysts. Electronically Signed   By: Kathreen Devoid   On: 08/28/2018 00:07   Dg Abd Portable 1v-small Bowel Obstruction Protocol-initial, 8 Hr Delay  Result Date: 08/28/2018 CLINICAL DATA:  8 hour delay from small-bowel follow-through. Patient feels much better. EXAM: PORTABLE ABDOMEN - 1 VIEW COMPARISON:  August 28, 2018 KUB FINDINGS: There is contrast throughout a normal caliber colon. No small bowel dilatation noted. No other acute abnormalities. IMPRESSION: No evidence of small bowel obstruction on this study. Contrast throughout the colon. Electronically Signed   By: Dorise Bullion III M.D   On: 08/28/2018 19:13    Anti-infectives: Anti-infectives (From admission, onward)   None       Assessment/Plan H/o DVT - hold apixaban (last dose 2/2) H/o Afib s/p ablation Colon cancer on chemo - followed by Dr. 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 D/c NG tube and advance to clear liquids. Continue ambulating. Full liquids tonight if she is tolerating clears.  ID - none FEN - IVF, CLD VTE - SCDs, IV heparin Foley - none   LOS: 1 day    Wellington [place] , [address] [facility] 08/29/2018, 8:50 AM Pager: [phone] Mon-Thurs 7:00 am-4:30 pm Fri 7:00 am -11:30 AM Sat-Sun 7:00 am-11:30 am

## 2018-08-29 NOTE — Progress Notes (Signed)
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 last chemo was 2 weeks ago presents to the ER with sudden onset of abdominal pain last evening around 6 PM. 1 hour prior to the abdominal pain patient had a regular bowel movement. Following the abdominal pain patient started having multiple episodes of vomiting.Pain was excruciating and patient decided to come to the ER. In the ER CAT scan shows bowel obstruction with transition point in the right lower quadrant. Dr. Johney Maine  on-call general surgeon was consulted and patient was placed NG tube and will be seeing patient in consult.  Assessment and plan  1. Small bowel obstruction in the setting of known peritoneal carcinomatosis-general surgeon Dr. Johney Maine has been consulted. Patient placed on NG tube. N.p.o. , IV fluids. clinically improving. Further recommendations per general surgery. Small bowel follow through protocol has been initiated. Contrast noted in the colon. Bowel function is returning. . Patient had a BM on 2/3 after receiving Dulcolax suppository. Several bowel movements today. NG tube discontinued. Advance diet as tolerated 2. 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 Last dose was yesterday morning. In view of possible  procedure for the small bowel obstruction will keep patient on heparin for now. 3. Colon cancer being followed by oncologist Dr. Manuella Ghazi at [facility]. Last chemo was about 2 weeks ago. 1/21 . Patient is currently receiving Avastin,fluorouracil,oxaliplatin, next last cycle was 08/29/18 as per Dr. Octaviano Glow 4. History of atrial fibrillation status post ablation. 5. History of subclinical thyroid disorder being followed by endocrinologist. Not on any medication at this time.ssessment and plan    DVT prophylaxsis heparin drip  Code Status:  Full code     Family Communication: Discussed in detail with the patient, all imaging results, lab results explained to the patient   Disposition Plan:  Anticipate  Discharge tomorrow if okay with surgery      Consultants:  surgery  Procedures:  none  Antibiotics: Anti-infectives (From admission, onward)   None         HPI/Subjective: Several bowel movements today, NG tube removed, denies any nausea vomiting  Objective: Vitals:   08/28/18 1516 08/28/18 2053 08/29/18 0459 08/29/18 0654  BP: (!) 142/79 (!) 146/73 (!) 171/85 (!) 171/69  Pulse: 74 76 82   Resp: 14 20 17    Temp: 98.2 F (36.8 C) (!) 97.5 F (36.4 C) 97.6 F (36.4 C)   TempSrc: Oral Oral Oral   SpO2: 97% 93% 94%   Weight:   76.7 kg   Height:        Intake/Output Summary (Last 24 hours) at 08/29/2018 0814 Last data filed at 08/29/2018 0700 Gross per 24 hour  Intake 3550.85 ml  Output 1053 ml  Net 2497.85 ml    Exam:  Examination:  General exam:  Appears calm and comfortable  Respiratory system: Clear to auscultation. Respiratory effort normal. Cardiovascular system: S1 & S2 heard, RRR. No JVD, murmurs, rubs, gallops or clicks. No pedal edema. Gastrointestinal system: Abdomen is nondistended, soft and nontender. No organomegaly or masses felt.  Significantly decreased bowel sounds  Central nervous system: Alert and oriented. No focal neurological  deficits. Extremities: Symmetric 5 x 5 power. Skin: No rashes, lesions or ulcers Psychiatry: Judgement and insight appear normal. Mood & affect appropriate.     Data Reviewed: I have personally reviewed following labs and imaging studies  Micro Results No results found for this or any previous visit (from the past 240 hour(s)).  Radiology Reports Dg Abdomen 1 View  Result Date: 08/28/2018 CLINICAL DATA:  Nasogastric tube placement EXAM: ABDOMEN - 1 VIEW COMPARISON:  CT abdomen 08/27/2018 FINDINGS: Nasogastric tube with the tip projecting over the fundus of the stomach. There is relative paucity of bowel gas. There is no evidence of pneumoperitoneum, portal venous gas or pneumatosis. There are no pathologic calcifications along the expected course of the ureters. There is excreted contrast in the renal collecting system bilaterally. The osseous structures are unremarkable. IMPRESSION: Nasogastric tube with the tip projecting over the fundus of the stomach. Electronically Signed   By: Kathreen Devoid   On: 08/28/2018 01:42   Ct Abdomen Pelvis W Contrast  Result Date: 08/28/2018 CLINICAL DATA:  Abdominal pain, nausea, vomiting EXAM: CT ABDOMEN AND PELVIS WITH CONTRAST TECHNIQUE: Multidetector CT imaging of the abdomen and pelvis was performed using the standard protocol following bolus administration of intravenous contrast. CONTRAST:  116mL ISOVUE-300 IOPAMIDOL (ISOVUE-300) INJECTION 61% COMPARISON:  09/30/2017 FINDINGS: Lower chest: No acute abnormality. Hepatobiliary: Multiple hypodense, fluid attenuating hepatic masses with the largest in the left hepatic lobe measuring 5 x 4.1 cm consistent with cysts. No solid hepatic mass. No intrahepatic or extrahepatic biliary ductal dilatation. Normal gallbladder. Pancreas: Unremarkable. No pancreatic ductal dilatation or surrounding inflammatory changes. Spleen: Normal in size without focal abnormality. Adrenals/Urinary Tract: Adrenal glands are unremarkable.  10 mm hypodense, fluid attenuating right renal mass most consistent with a small cyst. Kidneys are otherwise normal, without renal calculi, focal lesion, or hydronephrosis. Bladder is unremarkable. Stomach/Bowel: Stomach is within normal limits. Multiple dilated loops of small bowel measuring up to 3 cm. Multiple fluid-filled loops of small bowel with multiple air-fluid levels. Relative transition point in the right lower quadrant. Bowel appears to be tethered in the right lower quadrant suggesting adhesions. No pneumatosis, pneumoperitoneum or portal venous gas. Vascular/Lymphatic: Normal caliber abdominal aorta with atherosclerosis. No lymphadenopathy. Reproductive: Uterus and bilateral adnexa are unremarkable. Other: No abdominal wall hernia or abnormality. Small amount of pelvic free fluid. Musculoskeletal: No acute osseous abnormality. No aggressive osseous lesion. Mild osteoarthritis of bilateral SI joints. 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 Bilateral facet arthropathy at L3-4. IMPRESSION: 1. Multiple dilated loops of small bowel with air-fluid levels with a relative transition point in the right lower quadrant most concerning for a small bowel obstruction likely secondary to adhesions. 2.  Aortic Atherosclerosis (ICD10-I70.0). 3. Multiple liver cysts. Electronically Signed   By: Kathreen Devoid   On: 08/28/2018 00:07   Dg Abd Portable 1v-small Bowel Obstruction Protocol-initial, 8 Hr Delay  Result Date: 08/28/2018 CLINICAL DATA:  8 hour delay from small-bowel follow-through. Patient feels much better. EXAM: PORTABLE ABDOMEN - 1 VIEW COMPARISON:  August 28, 2018 KUB FINDINGS: There is contrast throughout a normal caliber colon. No small  bowel dilatation noted. No other acute abnormalities. IMPRESSION: No evidence of small bowel obstruction on this study. Contrast throughout the colon. Electronically Signed   By: Dorise Bullion III M.D   On:  08/28/2018 19:13     CBC Recent Labs  Lab 08/27/18 2207 08/28/18 0604  WBC 12.2* 7.2  HGB 13.5 11.7*  HCT 42.7 38.1  PLT 296 226  MCV 93.4 96.9  MCH 29.5 29.8  MCHC 31.6 30.7  RDW 14.3 14.3  LYMPHSABS  --  1.5  MONOABS  --  0.6  EOSABS  --  0.0  BASOSABS  --  0.0    Chemistries  Recent Labs  Lab 08/27/18 2207 08/28/18 0604  NA 138 138  K 4.0 4.3  CL 103 107  CO2 24 27  GLUCOSE 154* 111*  BUN 11 9  CREATININE 0.89 0.75  CALCIUM 9.6 8.7*  AST 20 16  ALT 15 12  ALKPHOS 54 44  BILITOT 0.7 0.4   ------------------------------------------------------------------------------------------------------------------ estimated creatinine clearance is 69.8 mL/min (by C-G formula based on SCr of 0.75 mg/dL). ------------------------------------------------------------------------------------------------------------------ No results for input(s): HGBA1C in the last 72 hours. ------------------------------------------------------------------------------------------------------------------ No results for input(s): CHOL, HDL, LDLCALC, TRIG, CHOLHDL, LDLDIRECT in the last 72 hours. ------------------------------------------------------------------------------------------------------------------ No results for input(s): TSH, T4TOTAL, T3FREE, THYROIDAB in the last 72 hours.  Invalid input(s): FREET3 ------------------------------------------------------------------------------------------------------------------ No results for input(s): VITAMINB12, FOLATE, FERRITIN, TIBC, IRON, RETICCTPCT in the last 72 hours.  Coagulation profile No results for input(s): INR, PROTIME in the last 168 hours.  No results for input(s): DDIMER in the last 72 hours.  Cardiac Enzymes No results for input(s): CKMB, TROPONINI, MYOGLOBIN in the last 168 hours.  Invalid input(s): CK ------------------------------------------------------------------------------------------------------------------ Invalid  input(s): POCBNP   CBG: Recent Labs  Lab 08/28/18 0617 08/28/18 1712 08/28/18 2351  GLUCAP 95 103* 93       Studies: Dg Abdomen 1 View  Result Date: 08/28/2018 CLINICAL DATA:  Nasogastric tube placement EXAM: ABDOMEN - 1 VIEW COMPARISON:  CT abdomen 08/27/2018 FINDINGS: Nasogastric tube with the tip projecting over the fundus of the stomach. There is relative paucity of bowel gas. There is no evidence of pneumoperitoneum, portal venous gas or pneumatosis. There are no pathologic calcifications along the expected course of the ureters. There is excreted contrast in the renal collecting system bilaterally. The osseous structures are unremarkable. IMPRESSION: Nasogastric tube with the tip projecting over the fundus of the stomach. Electronically Signed   By: Kathreen Devoid   On: 08/28/2018 01:42   Ct Abdomen Pelvis W Contrast  Result Date: 08/28/2018 CLINICAL DATA:  Abdominal pain, nausea, vomiting EXAM: CT ABDOMEN AND PELVIS WITH CONTRAST TECHNIQUE: Multidetector CT imaging of the abdomen and pelvis was performed using the standard protocol following bolus administration of intravenous contrast. CONTRAST:  116mL ISOVUE-300 IOPAMIDOL (ISOVUE-300) INJECTION 61% COMPARISON:  09/30/2017 FINDINGS: Lower chest: No acute abnormality. Hepatobiliary: Multiple hypodense, fluid attenuating hepatic masses with the largest in the left hepatic lobe measuring 5 x 4.1 cm consistent with cysts. No solid hepatic mass. No intrahepatic or extrahepatic biliary ductal dilatation. Normal gallbladder. Pancreas: Unremarkable. No pancreatic ductal dilatation or surrounding inflammatory changes. Spleen: Normal in size without focal abnormality. Adrenals/Urinary Tract: Adrenal glands are unremarkable. 10 mm hypodense, fluid attenuating right renal mass most consistent with a small cyst. Kidneys are otherwise normal, without renal calculi, focal lesion, or hydronephrosis. Bladder is unremarkable. Stomach/Bowel: Stomach is within  normal limits. Multiple dilated loops of small bowel measuring up to 3 cm. Multiple fluid-filled loops of  small bowel with multiple air-fluid levels. Relative transition point in the right lower quadrant. Bowel appears to be tethered in the right lower quadrant suggesting adhesions. No pneumatosis, pneumoperitoneum or portal venous gas. Vascular/Lymphatic: Normal caliber abdominal aorta with atherosclerosis. No lymphadenopathy. Reproductive: Uterus and bilateral adnexa are unremarkable. Other: No abdominal wall hernia or abnormality. Small amount of pelvic free fluid. Musculoskeletal: No acute osseous abnormality. No aggressive osseous lesion. Mild osteoarthritis of bilateral SI joints. 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 Bilateral facet arthropathy at L3-4. IMPRESSION: 1. Multiple dilated loops of small bowel with air-fluid levels with a relative transition point in the right lower quadrant most concerning for a small bowel obstruction likely secondary to adhesions. 2.  Aortic Atherosclerosis (ICD10-I70.0). 3. Multiple liver cysts. Electronically Signed   By: Kathreen Devoid   On: 08/28/2018 00:07   Dg Abd Portable 1v-small Bowel Obstruction Protocol-initial, 8 Hr Delay  Result Date: 08/28/2018 CLINICAL DATA:  8 hour delay from small-bowel follow-through. Patient feels much better. EXAM: PORTABLE ABDOMEN - 1 VIEW COMPARISON:  August 28, 2018 KUB FINDINGS: There is contrast throughout a normal caliber colon. No small bowel dilatation noted. No other acute abnormalities. IMPRESSION: No evidence of small bowel obstruction on this study. Contrast throughout the colon. Electronically Signed   By: Dorise Bullion III M.D   On: 08/28/2018 19:13      No results found for: HGBA1C Lab Results  Component Value Date   CREATININE 0.75 08/28/2018       Scheduled Meds: . bisacodyl  10 mg Rectal Daily  . lip balm  1 application Topical BID    Continuous Infusions: . heparin 950 Units/hr (08/29/18 0657)  . lactated ringers       LOS: 1 day    Time spent: >30 MINS    Reyne Dumas  [facility] Pager [phone]. If 7PM-7AM, please contact night-coverage at [email or web address], password [facility] 08/29/2018, 8:14 AM  LOS: 1 day

## 2018-08-29 NOTE — Progress Notes (Signed)
ANTICOAGULATION CONSULT NOTE - Follow Up Consult  Pharmacy Consult for Heparin Indication: hx VTE + PAF-->bridge while NPO, off Eliquis  Allergies  Allergen Reactions  . Erythromycin Nausea And Vomiting  . Macrobid [Nitrofurantoin Macrocrystal] Nausea And Vomiting    Patient Measurements:Height: 5\' 7"  (170.2 cm) Weight: 169 lb 3.2 oz (76.7 kg) IBW/kg (Calculated) : 61.6 HEPARIN DW (KG): 76.2   Vital Signs: Temp: 97.6 F (36.4 C) (02/04 0459) Temp Source: Oral (02/04 0459) BP: 171/69 (02/04 0654) Pulse Rate: 82 (02/04 0459)  Labs: Recent Labs    08/27/18 2207  08/28/18 0604 08/28/18 1331 08/28/18 2214 08/29/18 0806  HGB 13.5  --  11.7*  --   --  11.5*  HCT 42.7  --  38.1  --   --  37.4  PLT 296  --  226  --   --  210  APTT  --    < > 29 78* 103* 95*  HEPARINUNFRC  --   --  0.56 0.92*  --  0.83*  CREATININE 0.89  --  0.75  --   --   --    < > = values in this interval not displayed.    Estimated Creatinine Clearance: 69.8 mL/min (by C-G formula based on SCr of 0.75 mg/dL).   Medical History: Past Medical History:  Diagnosis Date  . Benign liver cysts 08/28/2018  . Cancer of sigmoid colon ([place]) 08/28/2018  . History of adenomatous polyp of colon 07/15/2017  . Liver lesion   . Paroxysmal atrial fibrillation ([place])    RFAblation 4/14 [facility]  . 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 Pulmonary embolism ([place])    post RFCA atrial fib  [facility] 4/14    Medications:  Infusions:  . sodium chloride    . heparin 950 Units/hr (08/29/18 0657)  . lactated ringers     Eliquis PTA- last dose 2/2 @ 0600  Assessment: 73 yo F who takes Eliquis PTA for hx DVT/PE.  She also has hx PAF.  She is admitted with SBO.  Pharmacy is asked to dose heparin while patient is NPO.    Today, 08/29/2018  PTT 95 sec (therapeutic)  Heparin level 0.83 (elevated but may be showing some residual effects from Eliquis)  CBC: Hgb  decreased slightly, Plts wnl  No bleeding reported  SCr 0.75, CrCl ~70 ml/min  Goal of Therapy:  APTT 66-102 seconds Heparin level 0.3-0.7 Monitor platelets by anticoagulation protocol: Yes   Plan:   Continue heparin drip at 950 units/hr  Daily heparin level and aPTT - once these correlate, will monitor using heparin levels only  Daily CBC while on heparin  Peggyann Juba, PharmD, BCPS Pager: [phone] 08/29/2018,8:54 AM

## 2018-08-30 DIAGNOSIS — I4819 Other persistent atrial fibrillation: Secondary | ICD-10-CM

## 2018-08-30 DIAGNOSIS — C187 Malignant neoplasm of sigmoid colon: Principal | ICD-10-CM

## 2018-08-30 LAB — GLUCOSE, CAPILLARY
Glucose-Capillary: 81 mg/dL (ref 70–99)
Glucose-Capillary: 88 mg/dL (ref 70–99)

## 2018-08-30 LAB — COMPREHENSIVE METABOLIC PANEL
ALBUMIN: 3.1 g/dL — AB (ref 3.5–5.0)
ALT: 11 U/L (ref 0–44)
AST: 16 U/L (ref 15–41)
Alkaline Phosphatase: 38 U/L (ref 38–126)
Anion gap: 5 (ref 5–15)
BUN: 5 mg/dL — ABNORMAL LOW (ref 8–23)
CO2: 26 mmol/L (ref 22–32)
Calcium: 8.7 mg/dL — ABNORMAL LOW (ref 8.9–10.3)
Chloride: 109 mmol/L (ref 98–111)
Creatinine, Ser: 0.73 mg/dL (ref 0.44–1.00)
GFR calc Af Amer: >60 mL/min (ref >60)
GFR calc non Af Amer: >60 mL/min (ref >60)
GLUCOSE: 92 mg/dL (ref 70–99)
POTASSIUM: 3.8 mmol/L (ref 3.5–5.1)
Sodium: 140 mmol/L (ref 135–145)
Total Bilirubin: 0.4 mg/dL (ref 0.3–1.2)
Total Protein: 5.5 g/dL — ABNORMAL LOW (ref 6.5–8.1)

## 2018-08-30 LAB — CBC
HEMATOCRIT: 34.1 % — AB (ref 36.0–46.0)
Hemoglobin: 10.7 g/dL — ABNORMAL LOW (ref 12.0–15.0)
MCH: 30.1 pg (ref 26.0–34.0)
MCHC: 31.4 g/dL (ref 30.0–36.0)
MCV: 95.8 fL (ref 80.0–100.0)
Platelets: 162 10*3/uL (ref 150–400)
RBC: 3.56 MIL/uL — ABNORMAL LOW (ref 3.87–5.11)
RDW: 14 % (ref 11.5–15.5)
WBC: 4.2 10*3/uL (ref 4.0–10.5)
nRBC: 0 % (ref 0.0–0.2)

## 2018-08-30 LAB — HEPARIN LEVEL (UNFRACTIONATED): Heparin Unfractionated: 0.76 IU/mL — ABNORMAL HIGH (ref 0.30–0.70)

## 2018-08-30 LAB — APTT: aPTT: 105 seconds — ABNORMAL HIGH (ref 24–36)

## 2018-08-30 MED ORDER — BISACODYL 10 MG RE SUPP: 10.0000 mg | Freq: Every day | RECTAL | 0 refills | Status: AC

## 2018-08-30 MED ORDER — ONDANSETRON HCL 4 MG PO TABS: 4.0000 mg | ORAL_TABLET | Freq: Every day | ORAL | 1 refills | Status: AC | PRN

## 2018-08-30 MED ORDER — MORPHINE SULFATE (CONCENTRATE) 10 MG/0.5ML PO SOLN: 10.0000 mg | Freq: Four times a day (QID) | ORAL | 0 refills | Status: AC | PRN

## 2018-08-30 MED ORDER — HEPARIN SOD (PORK) LOCK FLUSH 100 UNIT/ML IV SOLN
500.0000 [IU] | INTRAVENOUS | Status: AC | PRN
  Administered 2018-08-30: 500 [IU]

## 2018-08-30 MED ORDER — HEPARIN (PORCINE) 25000 UT/250ML-% IV SOLN: 850.0000 [IU]/h | INTRAVENOUS | Status: DC

## 2018-08-30 MED ORDER — POLYETHYLENE GLYCOL 3350 17 G PO PACK: 17.0000 g | PACK | Freq: Two times a day (BID) | ORAL | 0 refills | Status: AC

## 2018-08-30 MED ORDER — MORPHINE SULFATE (CONCENTRATE) 10 MG/0.5ML PO SOLN: 10.0000 mg | Freq: Four times a day (QID) | ORAL | 0 refills | Status: DC | PRN

## 2018-08-30 NOTE — Progress Notes (Signed)
[facility] Progress Note     Subjective: CC-  Feeling much better today. Tolerating full liquids. She has been advanced to a soft diet. Denies n/v. She had another loose BM last night and is passing flatus.   Objective: Vital signs in last 24 hours: Temp:  [98.2 F (36.8 C)-99.2 F (37.3 C)] 98.2 F (36.8 C) (02/05 0510) Pulse Rate:  [64-84] 64 (02/05 0510) Resp:  [17-18] 18 (02/05 0510) BP: (143-158)/(76-81) 143/81 (02/05 0510) SpO2:  [92 %-95 %] 95 % (02/05 0510) Weight:  [75.8 kg] 75.8 kg (02/05 0510) Last BM Date: 08/29/18  Intake/Output from previous day: 02/04 0701 - 02/05 0700 In: 240 [P.O.:240] Out: 100 [Emesis/NG output:100] Intake/Output this shift: No intake/output data recorded.  PE: Gen:  Alert, NAD, pleasant HEENT: EOM's intact, pupils equal and round Pulm:  effort normal Abd: Soft, ND, NT, +BS Psych: A&Ox3  Skin: warm and dry  Lab Results:  Recent Labs    08/29/18 0806 08/30/18 0605  WBC 7.6 4.2  HGB 11.5* 10.7*  HCT 37.4 34.1*  PLT 210 162   BMET Recent Labs    08/28/18 0604 08/30/18 0605  NA 138 140  K 4.3 3.8  CL 107 109  CO2 27 26  GLUCOSE 111* 92  BUN 9 5*  CREATININE 0.75 0.73  CALCIUM 8.7* 8.7*   PT/INR No results for input(s): LABPROT, INR in the last 72 hours. CMP     Component Value Date/Time   NA 140 08/30/2018 0605   K 3.8 08/30/2018 0605   CL 109 08/30/2018 0605   CO2 26 08/30/2018 0605   GLUCOSE 92 08/30/2018 0605   BUN 5 (L) 08/30/2018 0605   CREATININE 0.73 08/30/2018 0605   CALCIUM 8.7 (L) 08/30/2018 0605   PROT 5.5 (L) 08/30/2018 0605   ALBUMIN 3.1 (L) 08/30/2018 0605   AST 16 08/30/2018 0605   ALT 11 08/30/2018 0605   ALKPHOS 38 08/30/2018 0605   BILITOT 0.4 08/30/2018 0605   GFRNONAA >60 08/30/2018 0605   GFRAA >60 08/30/2018 0605   Lipase     Component Value Date/Time   LIPASE 23 08/27/2018 2207       Studies/Results: Dg Abd Portable 1v-small Bowel Obstruction  Protocol-initial, 8 Hr Delay  Result Date: 08/28/2018 CLINICAL DATA:  8 hour delay from small-bowel follow-through. Patient feels much better. EXAM: PORTABLE ABDOMEN - 1 VIEW COMPARISON:  August 28, 2018 KUB FINDINGS: There is contrast throughout a normal caliber colon. No small bowel dilatation noted. No other acute abnormalities. IMPRESSION: No evidence of small bowel obstruction on this study. Contrast throughout the colon. Electronically Signed   By: Dorise Bullion III M.D   On: 08/28/2018 19:13    Anti-infectives: Anti-infectives (From admission, onward)   None       Assessment/Plan H/o DVT on apixaban H/o Afib s/p ablation Colon cancer on chemo - followed by Dr. Manuella Ghazi at [facility], last chemo ~2 weeks ago Avastin,fluorouracil,oxaliplatin  Small bowel obstruction in the setting of a patient with known peritoneal carcinomatosis from sigmoid colon cancer - Agree with advancing to soft diet. If patient tolerates this she is stable for discharge from surgical standpoint. Recommend follow up with oncologist Dr. Manuella Ghazi at [facility].  ID - none FEN - IVF, soft diet VTE - SCDs, IV heparin Foley - none   LOS: 2 days    Wellington Hampshire , [facility] 08/30/2018, 9:15 AM Pager: [phone] Mon-Thurs 7:00 am-4:30 pm Fri 7:00 am -11:30 AM Sat-Sun 7:00  am-11:30 am

## 2018-08-30 NOTE — Discharge Summary (Addendum)
Physician Discharge Summary  Tonya Stanley MRN: [id number] DOB/AGE: 1945-12-25 73 y.o.  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 Manuella Ghazi at [facility].      Allergies as of 08/30/2018      Reactions   Erythromycin Nausea And Vomiting   Macrobid [nitrofurantoin Macrocrystal] Nausea And Vomiting      Medication List    TAKE these medications   bisacodyl 10 MG suppository Commonly known as:  DULCOLAX Place 1 suppository (10 mg total) rectally daily.   ELIQUIS 5 MG Tabs tablet Generic drug:  apixaban Take 5 mg by mouth 2 (two) times daily.   morphine CONCENTRATE 10 MG/0.5ML Soln concentrated solution Take 0.5 mLs (10 mg total) by mouth every 6 (six) hours as needed for up to 7 days for severe pain. Please send renewal requests to patients oncologist Dr Manuella Ghazi   ondansetron 4 MG tablet Commonly known as:  ZOFRAN Take 1 tablet (4 mg total) by mouth daily as needed for nausea or vomiting.   pantoprazole 40 MG tablet Commonly known as:  PROTONIX Take 40 mg by mouth daily.   polyethylene glycol packet Commonly known as:  MIRALAX / GLYCOLAX Take 17 g by mouth 2 (two) times daily.        Discharge Condition:  STABLE  Discharge  Instructions Get Medicines reviewed and adjusted: Please take all your medications with you for your next visit with your Primary MD  Please request your Primary MD to go over all hospital tests and procedure/radiological results at the follow up, please ask your Primary MD to get all Hospital records sent to his/her office.  If you experience worsening of your admission symptoms, develop shortness of breath, life threatening emergency, suicidal or homicidal thoughts you must seek medical attention immediately by calling 911 or calling your MD immediately  if symptoms less severe.  You must read complete instructions/literature along with all the possible adverse reactions/side effects for all the Medicines you take and that have been prescribed to you. Take any new Medicines after you have completely understood and accpet all the possible adverse reactions/side effects.   Do not drive when taking Pain medications.   Do not take more than prescribed Pain, Sleep and Anxiety Medications  Special Instructions: If you have smoked or chewed Tobacco  in the last 2 yrs please stop smoking, stop any regular Alcohol  and or any Recreational drug use.  Wear Seat belts while driving.  Please note  You were cared for by a hospitalist during your hospital stay. Once you are discharged, your primary care physician will handle any further medical issues. Please note that NO REFILLS for any discharge medications will be authorized once you are discharged, as it is imperative that you return  to your primary care physician (or establish a relationship with a primary care physician if you do not have one) for your aftercare needs so that they can reassess your need for medications and monitor your lab values.     Allergies  Allergen Reactions  . Erythromycin Nausea And Vomiting  . Macrobid [Nitrofurantoin Macrocrystal] Nausea And Vomiting      Disposition: Discharge disposition: 01-Home or Self  Care        Consults:  GENERAL SURGERY*    Significant Diagnostic Studies:  Dg Abdomen 1 View  Result Date: 08/28/2018 CLINICAL DATA:  Nasogastric tube placement EXAM: ABDOMEN - 1 VIEW COMPARISON:  CT abdomen 08/27/2018 FINDINGS: Nasogastric tube with the tip projecting over the fundus of the stomach. There is relative paucity of bowel gas. There is no evidence of pneumoperitoneum, portal venous gas or pneumatosis. There are no pathologic calcifications along the expected course of the ureters. There is excreted contrast in the renal collecting system bilaterally. The osseous structures are unremarkable. IMPRESSION: Nasogastric tube with the tip projecting over the fundus of the stomach. Electronically Signed   By: Kathreen Devoid   On: 08/28/2018 01:42   Ct Abdomen Pelvis W Contrast  Result Date: 08/28/2018 CLINICAL DATA:  Abdominal pain, nausea, vomiting EXAM: CT ABDOMEN AND PELVIS WITH CONTRAST TECHNIQUE: Multidetector CT imaging of the abdomen and pelvis was performed using the standard protocol following bolus administration of intravenous contrast. CONTRAST:  131mL ISOVUE-300 IOPAMIDOL (ISOVUE-300) INJECTION 61% COMPARISON:  09/30/2017 FINDINGS: Lower chest: No acute abnormality. Hepatobiliary: Multiple hypodense, fluid attenuating hepatic masses with the largest in the left hepatic lobe measuring 5 x 4.1 cm consistent with cysts. No solid hepatic mass. No intrahepatic or extrahepatic biliary ductal dilatation. Normal gallbladder. Pancreas: Unremarkable. No pancreatic ductal dilatation or surrounding inflammatory changes. Spleen: Normal in size without focal abnormality. Adrenals/Urinary Tract: Adrenal glands are unremarkable. 10 mm hypodense, fluid attenuating right renal mass most consistent with a small cyst. Kidneys are otherwise normal, without renal calculi, focal lesion, or hydronephrosis. Bladder is unremarkable. Stomach/Bowel: Stomach is within normal limits. Multiple dilated loops of  small bowel measuring up to 3 cm. Multiple fluid-filled loops of small bowel with multiple air-fluid levels. Relative transition point in the right lower quadrant. Bowel appears to be tethered in the right lower quadrant suggesting adhesions. No pneumatosis, pneumoperitoneum or portal venous gas. Vascular/Lymphatic: Normal caliber abdominal aorta with atherosclerosis. No lymphadenopathy. Reproductive: Uterus and bilateral adnexa are unremarkable. Other: No abdominal wall hernia or abnormality. Small amount of pelvic free fluid. Musculoskeletal: No acute osseous abnormality. No aggressive osseous lesion. Mild osteoarthritis of bilateral SI joints. 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 Bilateral facet arthropathy at L3-4. IMPRESSION: 1. Multiple dilated loops of small bowel with air-fluid levels with a relative transition point in the right lower quadrant most concerning for a small bowel obstruction likely secondary to adhesions. 2.  Aortic Atherosclerosis (ICD10-I70.0). 3. Multiple liver cysts. Electronically Signed   By: Kathreen Devoid   On: 08/28/2018 00:07   Dg Abd Portable 1v-small Bowel Obstruction Protocol-initial, 8 Hr Delay  Result Date: 08/28/2018 CLINICAL DATA:  8 hour delay from small-bowel follow-through. Patient feels much better. EXAM: PORTABLE ABDOMEN - 1 VIEW COMPARISON:  August 28, 2018 KUB FINDINGS: There is contrast throughout a normal caliber colon. No small bowel dilatation noted. No other acute abnormalities. IMPRESSION: No evidence of small bowel obstruction on this study. Contrast throughout the colon. Electronically Signed   By:  Dorise Bullion III M.D   On: 08/28/2018 19:13        Filed Weights   08/28/18 0311 08/29/18 0459 08/30/18 0510  Weight: 76.2 kg 76.7 kg 75.8 kg     Microbiology: No results found for this or any previous visit (from the past 240 hour(s)).     Blood Culture    Component Value Date/Time    SDES URINE, CLEAN CATCH 11/17/2012 1147   SPECREQUEST NONE 11/17/2012 1147   CULT NO GROWTH 11/17/2012 1147   REPTSTATUS 11/18/2012 FINAL 11/17/2012 1147      Labs: Results for orders placed or performed during the hospital encounter of 08/27/18 (from the past 48 hour(s))  Urinalysis, Routine w reflex microscopic     Status: Abnormal   Collection Time: 08/28/18 12:48 PM  Result Value Ref Range   Color, Urine YELLOW YELLOW   APPearance CLOUDY (A) CLEAR   Specific Gravity, Urine >1.046 (H) 1.005 - 1.030   pH 5.0 5.0 - 8.0   Glucose, UA NEGATIVE NEGATIVE mg/dL   Hgb urine dipstick SMALL (A) NEGATIVE   Bilirubin Urine NEGATIVE NEGATIVE   Ketones, ur NEGATIVE NEGATIVE mg/dL   Protein, ur NEGATIVE NEGATIVE mg/dL   Nitrite NEGATIVE NEGATIVE   Leukocytes, UA LARGE (A) NEGATIVE   RBC / HPF 11-20 0 - 5 RBC/hpf   WBC, UA >50 (H) 0 - 5 WBC/hpf   Bacteria, UA FEW (A) NONE SEEN   Squamous Epithelial / LPF 21-50 0 - 5   Mucus PRESENT     Comment: Performed at [facility], [place] [address], [place], [place] [address]  Heparin level (unfractionated)     Status: Abnormal   Collection Time: 08/28/18  1:31 PM  Result Value Ref Range   Heparin Unfractionated 0.92 (H) 0.30 - 0.70 IU/mL    Comment: (NOTE) If heparin results are below expected values, and patient dosage has  been confirmed, suggest follow up testing of antithrombin III levels. Performed at [facility], [place] [address] [place], [place] [address]   APTT     Status: Abnormal   Collection Time: 08/28/18  1:31 PM  Result Value Ref Range   aPTT 78 (H) 24 - 36 seconds    Comment:        IF BASELINE aPTT IS ELEVATED, SUGGEST PATIENT RISK ASSESSMENT BE USED TO DETERMINE APPROPRIATE ANTICOAGULANT THERAPY. Performed at [facility], [place] [address], [place], [place] [address]   Glucose, capillary     Status: Abnormal   Collection Time: 08/28/18  5:12 PM  Result Value Ref  Range   Glucose-Capillary 103 (H) 70 - 99 mg/dL  APTT     Status: Abnormal   Collection Time: 08/28/18 10:14 PM  Result Value Ref Range   aPTT 103 (H) 24 - 36 seconds    Comment:        IF BASELINE aPTT IS ELEVATED, SUGGEST PATIENT RISK ASSESSMENT BE USED TO DETERMINE APPROPRIATE ANTICOAGULANT THERAPY. REPEATED TO VERIFY SPECIMEN CHECKED FOR CLOTS Performed at [place] [address] [place], [place] [address]   Glucose, capillary     Status: None   Collection Time: 08/28/18 11:51 PM  Result Value Ref Range   Glucose-Capillary 93 70 - 99 mg/dL  CBC     Status: Abnormal   Collection Time: 08/29/18  8:06 AM  Result Value Ref Range   WBC 7.6 4.0 - 10.5 K/uL   RBC 3.89 3.87 - 5.11 MIL/uL  Hemoglobin 11.5 (L) 12.0 - 15.0 g/dL   HCT 37.4 36.0 - 46.0 %   MCV 96.1 80.0 - 100.0 fL   MCH 29.6 26.0 - 34.0 pg   MCHC 30.7 30.0 - 36.0 g/dL   RDW 14.1 11.5 - 15.5 %   Platelets 210 150 - 400 K/uL   nRBC 0.0 0.0 - 0.2 %    Comment: Performed at [facility], [place] [address], [place], [place] [address]  APTT     Status: Abnormal   Collection Time: 08/29/18  8:06 AM  Result Value Ref Range   aPTT 95 (H) 24 - 36 seconds    Comment:        IF BASELINE aPTT IS ELEVATED, SUGGEST PATIENT RISK ASSESSMENT BE USED TO DETERMINE APPROPRIATE ANTICOAGULANT THERAPY. Performed at [facility], [place] [address], [place], [place] [address]   Heparin level (unfractionated)     Status: Abnormal   Collection Time: 08/29/18  8:06 AM  Result Value Ref Range   Heparin Unfractionated 0.83 (H) 0.30 - 0.70 IU/mL    Comment: (NOTE) If heparin results are below expected values, and patient dosage has  been confirmed, suggest follow up testing of antithrombin III levels. Performed at [facility], [place] [address] [place], [place] [address]   Glucose, capillary     Status: None   Collection Time: 08/29/18 11:38 AM  Result Value  Ref Range   Glucose-Capillary 98 70 - 99 mg/dL  Glucose, capillary     Status: None   Collection Time: 08/29/18 11:57 PM  Result Value Ref Range   Glucose-Capillary 88 70 - 99 mg/dL  CBC     Status: Abnormal   Collection Time: 08/30/18  6:05 AM  Result Value Ref Range   WBC 4.2 4.0 - 10.5 K/uL   RBC 3.56 (L) 3.87 - 5.11 MIL/uL   Hemoglobin 10.7 (L) 12.0 - 15.0 g/dL   HCT 34.1 (L) 36.0 - 46.0 %   MCV 95.8 80.0 - 100.0 fL   MCH 30.1 26.0 - 34.0 pg   MCHC 31.4 30.0 - 36.0 g/dL   RDW 14.0 11.5 - 15.5 %   Platelets 162 150 - 400 K/uL   nRBC 0.0 0.0 - 0.2 %    Comment: Performed at [facility], [place] [address], [place], [place] [address]  Heparin level (unfractionated)     Status: Abnormal   Collection Time: 08/30/18  6:05 AM  Result Value Ref Range   Heparin Unfractionated 0.76 (H) 0.30 - 0.70 IU/mL    Comment: (NOTE) If heparin results are below expected values, and patient dosage has  been confirmed, suggest follow up testing of antithrombin III levels. Performed at [facility], [place] [address] [place], [place] [address]   Comprehensive metabolic panel     Status: Abnormal   Collection Time: 08/30/18  6:05 AM  Result Value Ref Range   Sodium 140 135 - 145 mmol/L   Potassium 3.8 3.5 - 5.1 mmol/L   Chloride 109 98 - 111 mmol/L   CO2 26 22 - 32 mmol/L   Glucose, Bld 92 70 - 99 mg/dL   BUN 5 (L) 8 - 23 mg/dL   Creatinine, Ser 0.73 0.44 - 1.00 mg/dL   Calcium 8.7 (L) 8.9 - 10.3 mg/dL   Total Protein 5.5 (L) 6.5 - 8.1 g/dL   Albumin 3.1 (L) 3.5 - 5.0 g/dL   AST 16 15 - 41 U/L   ALT  11 0 - 44 U/L   Alkaline Phosphatase 38 38 - 126 U/L   Total Bilirubin 0.4 0.3 - 1.2 mg/dL   GFR calc non Af Amer >60 >60 mL/min   GFR calc Af Amer >60 >60 mL/min   Anion gap 5 5 - 15    Comment: Performed at [facility], [place] [address], [place], [place] [address]  APTT     Status: Abnormal   Collection Time: 08/30/18  6:05 AM  Result  Value Ref Range   aPTT 105 (H) 24 - 36 seconds    Comment:        IF BASELINE aPTT IS ELEVATED, SUGGEST PATIENT RISK ASSESSMENT BE USED TO DETERMINE APPROPRIATE ANTICOAGULANT THERAPY. Performed at [facility], [place] [address], [place], [place] [address]   Glucose, capillary     Status: None   Collection Time: 08/30/18  8:26 AM  Result Value Ref Range   Glucose-Capillary 81 70 - 99 mg/dL     Lipid Panel  No results found for: CHOL, TRIG, HDL, CHOLHDL, VLDL, LDLCALC, LDLDIRECT   No results found for: HGBA1C   Lab Results  Component Value Date   CREATININE 0.73 08/30/2018     HPI :  42-YEAR-OLD woman with history of abdominal pain and bowel changes.  Found to have sigmoid stricture in 2018.  Work-up not definitive for cancer but then had progression & it was more obvious.  Patient lives in [place] but was sent to [facility] and had treatment in [place].  Not seen by oncology or surgery in town here.  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  They stopped at diagnostic laparoscopy.  No resection done.  Patient claims this is the only surgery she is ever had in her abdomen.  Port-A-Cath placed.  On palliative chemotherapy by Dr. Octaviano Glow.  FOLFOX-based with Avastin.  Last dose of chemotherapy 08/15/2018.  Apparently not a candidate for HIPEC.   She had a history of crampy abdominal pain and was admitted in [facility] March 2019.  Concern for possible obstruction but tended to have diarrhea.  Gastroenteritis suspected.  She quickly improved and was out of the hospital within 48 hours.  This was before the diagnosis of carcinomatosis 4 months later.  She has a history of intermittent atrial fibrillation.  Is on chronic anticoagulation.  History of pulmonary embolism in 2014.  Also left upper extremity DVT is what she claims.  Patient normally moves  her bowels most days.  However had worsening severe abdominal pain last night.  Emesis.  None bloody.  Can emergency room.  CT scan concerning for small bowel obstruction with transition point in right lower quadrant.  Surgical consultation requested .  Rehydrated and nasogastric tube placed.   HOSPITAL COURSE:   1. Small bowel obstruction in the setting of known peritoneal carcinomatosis-general surgeon Dr.Grosshas been consulted. Patient placed on NG tube. N.p.o. , IV fluids. clinically improving.has had   multiple bowel movements.Small bowel follow through protocol  Ordered by surgery. Contrast noted in the colon. Bowel function has returned. . Patient had a BM on 2/3 after receiving Dulcolax suppository. Several bowel movements after the small bowel follow through study. NG tube discontinued. Tolerated diet  2. 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 . In view of possible procedure for the small bowel obstruction , patient initiated on heparin . Switch back to Eliquis prior to  discharge 3. Colon cancer being followed by oncologist Dr. Manuella Ghazi at [facility]. Last chemo was about 2 weeks ago.1/21 . Patient is currently receiving Avastin,fluorouracil,oxaliplatin, next last cycle was 08/29/18 as per Dr. Octaviano Glow 4. History of atrial fibrillation status post ablation. 5. History of subclinical thyroid disorder being followed by endocrinologist. Not on any medication at this time.ssessment and plan  Discharge Exam:   Blood pressure (!) 143/81, pulse 64, temperature 98.2 F (36.8 C), temperature source Oral, resp. rate 18, height 5\' 7"  (1.702 m), weight 75.8 kg, SpO2 95 %. Cardiovascular system: S1 & S2 heard, RRR. No JVD, murmurs, rubs, gallops or clicks. No pedal edema. Gastrointestinal system: Abdomen is nondistended, soft and nontender. No organomegaly or masses felt.  Significantly decreased bowel sounds  Central nervous system: Alert and oriented. No focal  neurological deficits. Extremities: Symmetric 5 x 5 power. Skin: No rashes, lesions or ulcers     Follow-up Information    Kathyrn Lass, MD. Call.   Specialty:  Family Medicine Why:  Hospital follow-up in 3-5 days Contact information: [place] [address] [phone]           Signed: Reyne Dumas 08/30/2018, 8:33 AM      Time needed to  prepare  discharge, discussed with the patient and family 35 minutes

## 2018-08-30 NOTE — Progress Notes (Signed)
Reviewed discharge paperwork and new medications with patient and husband.  Instructed patient to take morning dose of eliquis when arriving home and then take dose tonight.  Patient and husband verbalize understanding

## 2018-08-30 NOTE — Progress Notes (Signed)
ANTICOAGULATION CONSULT NOTE - Follow Up Consult  Pharmacy Consult for Heparin Indication: hx VTE + PAF-->bridge while NPO, off Eliquis  Allergies  Allergen Reactions  . Erythromycin Nausea And Vomiting  . Macrobid [Nitrofurantoin Macrocrystal] Nausea And Vomiting    Patient Measurements:Height: 5\' 7"  (170.2 cm) Weight: 167 lb 1.6 oz (75.8 kg) IBW/kg (Calculated) : 61.6 HEPARIN DW (KG): 76.2   Vital Signs: Temp: 98.2 F (36.8 C) (02/05 0510) Temp Source: Oral (02/05 0510) BP: 143/81 (02/05 0510) Pulse Rate: 64 (02/05 0510)  Labs: Recent Labs    08/27/18 2207  08/28/18 0604 08/28/18 1331 08/28/18 2214 08/29/18 0806 08/30/18 0605  HGB 13.5  --  11.7*  --   --  11.5* 10.7*  HCT 42.7  --  38.1  --   --  37.4 34.1*  PLT 296  --  226  --   --  210 162  APTT  --    < > 29 78* 103* 95* 105*  HEPARINUNFRC  --    < > 0.56 0.92*  --  0.83* 0.76*  CREATININE 0.89  --  0.75  --   --   --  0.73   < > = values in this interval not displayed.    Estimated Creatinine Clearance: 69.5 mL/min (by C-G formula based on SCr of 0.73 mg/dL).   Medical History: Past Medical History:  Diagnosis Date  . Benign liver cysts 08/28/2018  . Cancer of sigmoid colon ([place]) 08/28/2018  . History of adenomatous polyp of colon 07/15/2017  . Liver lesion   . Paroxysmal atrial fibrillation ([place])    RFAblation 4/14 [facility]  . 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 Pulmonary embolism ([place])    post RFCA atrial fib  [facility] 4/14    Medications:  Infusions:  . sodium chloride 75 mL/hr at 08/29/18 2358  . heparin 950 Units/hr (08/29/18 0657)  . lactated ringers     Eliquis PTA- last dose 2/2 @ 0600  Assessment: 73 yo F who takes Eliquis PTA for hx DVT/PE.  She also has hx PAF.  She is admitted with SBO.  Pharmacy is asked to dose heparin while patient is NPO - nonsurgical management at this time.    Today, 08/30/2018  PTT 105 sec  (slightly supratherapeutic)  Heparin level 0.76 (supratherapeutic and now correlating with heparin level indicating that effects of Eliquis have worn off)  CBC: Hgb decreased slightly, Plts wnl  No bleeding reported  SCr 0.73, CrCl ~70 ml/min  Goal of Therapy:  APTT 66-102 seconds Heparin level 0.3-0.7 Monitor platelets by anticoagulation protocol: Yes   Plan:   Decrease heparin drip at 850 units/hr  Recheck heparin level in 8hrs (now correlating with aPTT indicating that effects of Eliquis have worn off)  Daily heparin level   Daily CBC while on heparin  F/u ability to resume Eliquis as SBO resolves  Peggyann Juba, PharmD, BCPS Pager: [phone] 08/30/2018,6:49 AM

## 2018-10-04 ENCOUNTER — Emergency Department (HOSPITAL_COMMUNITY): Payer: Medicare Other

## 2018-10-04 ENCOUNTER — Inpatient Hospital Stay (HOSPITAL_COMMUNITY)
Admission: EM | Admit: 2018-10-04 | Discharge: 2018-10-07 | DRG: 375 | Disposition: A | Discharge status: Hospice | Payer: Medicare Other | Attending: Internal Medicine | Admitting: Internal Medicine

## 2018-10-04 ENCOUNTER — Other Ambulatory Visit: Payer: Self-pay

## 2018-10-04 ENCOUNTER — Encounter (HOSPITAL_COMMUNITY): Payer: Self-pay

## 2018-10-04 DIAGNOSIS — Z888 Allergy status to other drugs, medicaments and biological substances status: Secondary | ICD-10-CM

## 2018-10-04 DIAGNOSIS — Z86718 Personal history of other venous thrombosis and embolism: Secondary | ICD-10-CM | POA: Diagnosis not present

## 2018-10-04 DIAGNOSIS — K56609 Unspecified intestinal obstruction, unspecified as to partial versus complete obstruction: Secondary | ICD-10-CM

## 2018-10-04 DIAGNOSIS — E559 Vitamin D deficiency, unspecified: Secondary | ICD-10-CM | POA: Diagnosis present

## 2018-10-04 DIAGNOSIS — I82622 Acute embolism and thrombosis of deep veins of left upper extremity: Secondary | ICD-10-CM | POA: Diagnosis present

## 2018-10-04 DIAGNOSIS — Z7189 Other specified counseling: Secondary | ICD-10-CM | POA: Diagnosis not present

## 2018-10-04 DIAGNOSIS — C187 Malignant neoplasm of sigmoid colon: Secondary | ICD-10-CM | POA: Diagnosis present

## 2018-10-04 DIAGNOSIS — I4891 Unspecified atrial fibrillation: Secondary | ICD-10-CM | POA: Diagnosis present

## 2018-10-04 DIAGNOSIS — Z8249 Family history of ischemic heart disease and other diseases of the circulatory system: Secondary | ICD-10-CM | POA: Diagnosis not present

## 2018-10-04 DIAGNOSIS — Z79899 Other long term (current) drug therapy: Secondary | ICD-10-CM

## 2018-10-04 DIAGNOSIS — C786 Secondary malignant neoplasm of retroperitoneum and peritoneum: Secondary | ICD-10-CM | POA: Diagnosis present

## 2018-10-04 DIAGNOSIS — Z9889 Other specified postprocedural states: Secondary | ICD-10-CM

## 2018-10-04 DIAGNOSIS — Z86711 Personal history of pulmonary embolism: Secondary | ICD-10-CM

## 2018-10-04 DIAGNOSIS — I82722 Chronic embolism and thrombosis of deep veins of left upper extremity: Secondary | ICD-10-CM

## 2018-10-04 DIAGNOSIS — Z515 Encounter for palliative care: Secondary | ICD-10-CM | POA: Diagnosis present

## 2018-10-04 DIAGNOSIS — I10 Essential (primary) hypertension: Secondary | ICD-10-CM

## 2018-10-04 DIAGNOSIS — Z7901 Long term (current) use of anticoagulants: Secondary | ICD-10-CM | POA: Diagnosis not present

## 2018-10-04 DIAGNOSIS — Z66 Do not resuscitate: Secondary | ICD-10-CM | POA: Diagnosis present

## 2018-10-04 DIAGNOSIS — C801 Malignant (primary) neoplasm, unspecified: Secondary | ICD-10-CM | POA: Diagnosis not present

## 2018-10-04 DIAGNOSIS — K565 Intestinal adhesions [bands], unspecified as to partial versus complete obstruction: Secondary | ICD-10-CM | POA: Diagnosis present

## 2018-10-04 DIAGNOSIS — R109 Unspecified abdominal pain: Secondary | ICD-10-CM | POA: Diagnosis present

## 2018-10-04 DIAGNOSIS — J984 Other disorders of lung: Secondary | ICD-10-CM | POA: Diagnosis present

## 2018-10-04 DIAGNOSIS — C800 Disseminated malignant neoplasm, unspecified: Secondary | ICD-10-CM | POA: Diagnosis present

## 2018-10-04 DIAGNOSIS — F419 Anxiety disorder, unspecified: Secondary | ICD-10-CM | POA: Diagnosis not present

## 2018-10-04 DIAGNOSIS — Z881 Allergy status to other antibiotic agents status: Secondary | ICD-10-CM | POA: Diagnosis not present

## 2018-10-04 DIAGNOSIS — I480 Paroxysmal atrial fibrillation: Secondary | ICD-10-CM | POA: Diagnosis present

## 2018-10-04 DIAGNOSIS — I82409 Acute embolism and thrombosis of unspecified deep veins of unspecified lower extremity: Secondary | ICD-10-CM | POA: Diagnosis present

## 2018-10-04 LAB — COMPREHENSIVE METABOLIC PANEL
ALT: 12 U/L (ref 0–44)
AST: 16 U/L (ref 15–41)
Albumin: 4 g/dL (ref 3.5–5.0)
Alkaline Phosphatase: 47 U/L (ref 38–126)
Anion gap: 11 (ref 5–15)
BUN: 13 mg/dL (ref 8–23)
CO2: 23 mmol/L (ref 22–32)
Calcium: 9.2 mg/dL (ref 8.9–10.3)
Chloride: 103 mmol/L (ref 98–111)
Creatinine, Ser: 0.91 mg/dL (ref 0.44–1.00)
GFR calc non Af Amer: >60 mL/min (ref >60)
Glucose, Bld: 114 mg/dL — ABNORMAL HIGH (ref 70–99)
Potassium: 3.7 mmol/L (ref 3.5–5.1)
Sodium: 137 mmol/L (ref 135–145)
Total Bilirubin: 0.9 mg/dL (ref 0.3–1.2)
Total Protein: 6.7 g/dL (ref 6.5–8.1)

## 2018-10-04 LAB — CBC
HCT: 38.2 % (ref 36.0–46.0)
Hemoglobin: 12.1 g/dL (ref 12.0–15.0)
MCH: 29.1 pg (ref 26.0–34.0)
MCHC: 31.7 g/dL (ref 30.0–36.0)
MCV: 91.8 fL (ref 80.0–100.0)
Platelets: 244 10*3/uL (ref 150–400)
RBC: 4.16 MIL/uL (ref 3.87–5.11)
RDW: 13.7 % (ref 11.5–15.5)
WBC: 7 10*3/uL (ref 4.0–10.5)
nRBC: 0 % (ref 0.0–0.2)

## 2018-10-04 LAB — LIPASE, BLOOD: Lipase: 22 U/L (ref 11–51)

## 2018-10-04 MED ORDER — SODIUM CHLORIDE 0.9 % IV BOLUS
1000.0000 mL | Freq: Once | INTRAVENOUS | Status: AC
  Administered 2018-10-04: 1000 mL via INTRAVENOUS

## 2018-10-04 MED ORDER — HEPARIN (PORCINE) 25000 UT/250ML-% IV SOLN
1200.0000 [IU]/h | INTRAVENOUS | Status: DC
  Administered 2018-10-04: 1100 [IU]/h via INTRAVENOUS
  Administered 2018-10-05: 1300 [IU]/h via INTRAVENOUS
  Filled 2018-10-04 (×3): qty 250

## 2018-10-04 MED ORDER — MORPHINE SULFATE (PF) 4 MG/ML IV SOLN
4.0000 mg | Freq: Once | INTRAVENOUS | Status: AC
  Administered 2018-10-04: 4 mg via INTRAVENOUS
  Filled 2018-10-04: qty 1

## 2018-10-04 MED ORDER — ALBUTEROL SULFATE (2.5 MG/3ML) 0.083% IN NEBU: 2.5000 mg | INHALATION_SOLUTION | RESPIRATORY_TRACT | Status: DC | PRN

## 2018-10-04 MED ORDER — ONDANSETRON HCL 4 MG/2ML IJ SOLN: 4.0000 mg | Freq: Four times a day (QID) | INTRAMUSCULAR | Status: DC | PRN

## 2018-10-04 MED ORDER — SODIUM CHLORIDE (PF) 0.9 % IJ SOLN
INTRAMUSCULAR | Status: AC
  Filled 2018-10-04: qty 50

## 2018-10-04 MED ORDER — SODIUM CHLORIDE 0.9% FLUSH
3.0000 mL | Freq: Once | INTRAVENOUS | Status: AC
  Administered 2018-10-04: 3 mL via INTRAVENOUS

## 2018-10-04 MED ORDER — IOPAMIDOL (ISOVUE-300) INJECTION 61%
100.0000 mL | Freq: Once | INTRAVENOUS | Status: AC | PRN
  Administered 2018-10-04: 100 mL via INTRAVENOUS

## 2018-10-04 MED ORDER — MORPHINE SULFATE (PF) 2 MG/ML IV SOLN
2.0000 mg | INTRAVENOUS | Status: DC | PRN
  Administered 2018-10-04: 2 mg via INTRAVENOUS
  Filled 2018-10-04: qty 1

## 2018-10-04 MED ORDER — ACETAMINOPHEN 650 MG RE SUPP: 650.0000 mg | Freq: Four times a day (QID) | RECTAL | Status: DC | PRN

## 2018-10-04 MED ORDER — SODIUM CHLORIDE 0.9 % IV SOLN
INTRAVENOUS | Status: AC
  Administered 2018-10-04: 23:00:00 via INTRAVENOUS

## 2018-10-04 MED ORDER — IOPAMIDOL (ISOVUE-300) INJECTION 61%
INTRAVENOUS | Status: AC
  Filled 2018-10-04: qty 100

## 2018-10-04 MED ORDER — ONDANSETRON HCL 4 MG/2ML IJ SOLN
4.0000 mg | Freq: Once | INTRAMUSCULAR | Status: AC
  Administered 2018-10-04: 4 mg via INTRAVENOUS
  Filled 2018-10-04: qty 2

## 2018-10-04 MED ORDER — IOHEXOL 300 MG/ML  SOLN: 100.0000 mL | Freq: Once | INTRAMUSCULAR | Status: DC | PRN

## 2018-10-04 MED ORDER — ACETAMINOPHEN 325 MG PO TABS
650.0000 mg | ORAL_TABLET | Freq: Four times a day (QID) | ORAL | Status: DC | PRN
  Administered 2018-10-06: 650 mg via ORAL
  Filled 2018-10-04: qty 2

## 2018-10-04 MED ORDER — LIDOCAINE VISCOUS HCL 2 % MT SOLN
OROMUCOSAL | Status: AC
  Filled 2018-10-04: qty 15

## 2018-10-04 MED ORDER — ONDANSETRON HCL 4 MG PO TABS: 4.0000 mg | ORAL_TABLET | Freq: Four times a day (QID) | ORAL | Status: DC | PRN

## 2018-10-04 NOTE — Progress Notes (Signed)
ANTICOAGULATION CONSULT NOTE - Initial Consult  Pharmacy Consult for Heparin (bridge apixaban) Indication: DVT  Allergies  Allergen Reactions  . Erythromycin Nausea And Vomiting  . Macrobid [Nitrofurantoin Macrocrystal] Nausea And Vomiting  . Polyethylene Glycol 3350     Other reaction(s): Other (See Comments) Total body spasms after high dose for colon prep. Well-tolerated in small doses subsequently.    Patient Measurements: Height: 5\' 7"  (170.2 cm) Weight: 161 lb (73 kg) IBW/kg (Calculated) : 61.6 Heparin Dosing Weight:   Vital Signs: Temp: 98.3 F (36.8 C) (03/11 2246) Temp Source: Oral (03/11 2246) BP: 143/83 (03/11 2246) Pulse Rate: 80 (03/11 2246)  Labs: Recent Labs    10/04/18 1824  HGB 12.1  HCT 38.2  PLT 244  CREATININE 0.91    Estimated Creatinine Clearance: 55.1 mL/min (by C-G formula based on SCr of 0.91 mg/dL).   Medical History: Past Medical History:  Diagnosis Date  . Benign liver cysts 08/28/2018  . Cancer of sigmoid colon ([place]) 08/28/2018  . History of adenomatous polyp of colon 07/15/2017  . Liver lesion   . Paroxysmal atrial fibrillation ([place])    RFAblation 4/14 [facility]  . 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 Pulmonary embolism ([place])    post RFCA atrial fib  [facility] 4/14    Medications:  Medications Prior to Admission  Medication Sig Dispense Refill Last Dose  . albuterol (VENTOLIN HFA) 108 (90 Base) MCG/ACT inhaler Inhale 2 puffs into the lungs every morning.   Past Week at Unknown time  . apixaban (ELIQUIS) 5 MG TABS tablet Take 5 mg by mouth 2 (two) times daily.   10/04/2018 at 7:00am  . pantoprazole (PROTONIX) 40 MG tablet Take 40 mg by mouth daily.    10/04/2018 at Unknown time  . bisacodyl (DULCOLAX) 10 MG suppository Place 1 suppository (10 mg total) rectally daily. (Patient not taking: Reported on 10/04/2018) 12 suppository 0 Not Taking at Unknown time  . ondansetron  (ZOFRAN) 4 MG tablet Take 1 tablet (4 mg total) by mouth daily as needed for nausea or vomiting. (Patient not taking: Reported on 10/04/2018) 30 tablet 1 Not Taking at Unknown time  . polyethylene glycol (MIRALAX / GLYCOLAX) packet Take 17 g by mouth 2 (two) times daily. (Patient not taking: Reported on 10/04/2018) 60 each 0 Not Taking at Unknown time   Infusions:    Assessment: Patient with history of DVT (left arm) and on apixaban 5mg  po bid with last dose noted 3/11 at 0700.    Goal of Therapy:  Heparin level 0.3-0.7 units/ml aPTT 66-102 seconds Monitor platelets by anticoagulation protocol: Yes   Plan:  Heparin drip at 1100 units/hr Check PTT/HL at 0800 3/12 CBC daily   Nani Skillern Crowford 10/04/2018,10:48 PM

## 2018-10-04 NOTE — H&P (Signed)
Tonya Stanley OIZ:[id number] DOB: 1946/02/27 DOA: 10/04/2018     PCP: Kathyrn Lass, MD   Outpatient Specialists:     Oncology   Dr. Jimmy Footman      Patient arrived to ER on 10/04/18 at 1811  Patient coming from: home Lives  With family    Chief Complaint:  Chief Complaint  Patient presents with   Abdominal Pain    HPI: Tonya Stanley is a 73 y.o. female with medical history significant of Colon cancer, A.fib sp ablation in 2014, LEft Arm DVT on eliquis,   Stage IV ca carcinomatosis, SBO in the past   Presented with  Abdominal pain 2day similar to prior SBO, no nausea have not had a bowel movement in 2 days patient stopped eating so that she could have her symptoms improved but developed significant abdominal pain and presented to emergency department.  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 currently on anticoagulation While in ER: Was given IV Morphin in ER now pain under control   The following Work up has been ordered so far:  Orders Placed This Encounter  Procedures   CT ABDOMEN PELVIS W CONTRAST   DG Abd Portable 1 View   Lipase, blood   Comprehensive metabolic panel   CBC   Urinalysis, Routine w reflex microscopic   Diet NPO time specified   Saline Lock IV, Maintain IV access   Insert NG/OG (Gastric Tube)   Consult to hospitalist      Following Medications were ordered in ER: Medications  iohexol (OMNIPAQUE) 300 MG/ML solution 100 mL (  Intravenous Canceled Entry 10/04/18 2004)  lidocaine (XYLOCAINE) 2 % viscous mouth solution (has no administration in time range)  sodium chloride flush (NS) 0.9 % injection 3 mL (3 mLs Intravenous Given 10/04/18 1903)  sodium chloride 0.9 % bolus 1,000 mL (0 mLs Intravenous Stopped 10/04/18 2025)  ondansetron (ZOFRAN) injection 4 mg (4 mg Intravenous Given 10/04/18 1858)  morphine 4 MG/ML injection 4 mg (4 mg Intravenous Given 10/04/18 1858)  iopamidol (ISOVUE-300) 61 % injection 100 mL (100 mLs Intravenous Contrast Given 10/04/18 2004)    Significant initial  Findings: Abnormal Labs Reviewed  COMPREHENSIVE METABOLIC PANEL - Abnormal; Notable for the following components:      Result Value   Glucose, Bld 114 (*)    All other components within normal limits     Lactic Acid, Venous No results found for: LATICACIDVEN  Na 137 K 3.7  Cr  stable,    Lab Results  Component Value Date   CREATININE 0.91 10/04/2018   CREATININE 0.73 08/30/2018   CREATININE 0.75 08/28/2018     Lipase 22 WBC  7.0  HG/HCT  stable,       Component Value Date/Time   HGB 12.1 10/04/2018 1824   HCT 38.2 10/04/2018 1824          CTabd/pelvis -small bowel obstruction and right lower quadrant In a similar area as prior ECG:  Not obtained     ED Triage Vitals  Enc Vitals Group     BP 10/04/18 1822 137/78     Pulse Rate 10/04/18 1822 95     Resp 10/04/18 1822 16     Temp 10/04/18 1822 (!) 97.4 F (36.3 C)     Temp Source 10/04/18 1822 Oral     SpO2 10/04/18 1822 94 %     Weight 10/04/18 1823 161 lb (73 kg)     Height 10/04/18 1823 5\' 7"  (1.702 m)     Head Circumference --      Peak Flow --      Pain Score 10/04/18 1822 7     Pain Loc --      Pain Edu? --      Excl. in [place]? --   TMAX(24)@       Latest  Blood pressure (!) 155/88, pulse 94, temperature (!) 97.4 F (36.3 C), temperature source Oral, resp. rate 16, height 5\' 7"  (1.702 m), weight 73 kg, SpO2 95 %.      Hospitalist was called  for admission for small bowel obstruction   Review of Systems:    Pertinent positives include:  fatigue, weight loss abdominal pain,  Constitutional:  No weight loss, night sweats, Fevers, chills, HEENT:  No headaches, Difficulty swallowing,Tooth/dental problems,Sore throat,  No sneezing, itching, ear ache, nasal congestion, post nasal drip,  Cardio-vascular:  No chest pain, Orthopnea, PND, anasarca, dizziness, palpitations.no Bilateral lower extremity swelling  GI:  No heartburn, indigestion,  nausea, vomiting, diarrhea, change in bowel habits, loss of appetite, melena, blood in stool, hematemesis Resp:  no shortness of breath at rest. No dyspnea on exertion, No excess mucus, no productive cough, No non-productive cough, No coughing up of blood.No change in color of mucus.No wheezing. Skin:  no rash or lesions. No jaundice GU:  no dysuria, change in color of urine, no urgency or frequency. No straining to urinate.  No flank pain.  Musculoskeletal:  No joint pain or no joint swelling. No decreased range of motion. No back pain.  Psych:  No change in mood or affect. No depression or anxiety. No memory loss.  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 She has never used smokeless tobacco. She reports that she does not drink alcohol or use drugs.     Family History:   Family History  Problem Relation Age of Onset   Hypertension Other     Allergies: Allergies  Allergen Reactions  Erythromycin Nausea And Vomiting   Macrobid [Nitrofurantoin Macrocrystal] Nausea And Vomiting   Polyethylene Glycol 3350     Other reaction(s): Other (See Comments) Total body spasms after high dose for colon prep. Well-tolerated in small doses subsequently.     Prior to Admission medications   Medication Sig Start Date End Date Taking? Authorizing Provider  albuterol (VENTOLIN HFA) 108 (90 Base) MCG/ACT inhaler Inhale 2 puffs into the lungs every morning. 09/21/17  Yes [provider]  apixaban (ELIQUIS) 5 MG TABS tablet Take 5 mg by mouth 2 (two) times daily.   Yes [provider]  pantoprazole (PROTONIX) 40 MG tablet Take 40 mg by mouth daily.  04/03/18 04/03/19 Yes [provider]  bisacodyl (DULCOLAX) 10 MG suppository Place 1 suppository (10 mg total) rectally daily. Patient not taking: Reported on 10/04/2018 08/30/18   Reyne Dumas, MD  ondansetron (ZOFRAN) 4 MG tablet Take 1 tablet (4 mg total) by mouth daily as needed for nausea or vomiting. Patient not taking: Reported on 10/04/2018 08/30/18 08/30/19  Reyne Dumas, MD  polyethylene glycol (MIRALAX / GLYCOLAX) packet Take 17 g by mouth 2 (two) times daily. Patient not taking: Reported on 10/04/2018 08/30/18   Reyne Dumas, MD   Physical Exam: Blood pressure (!) 155/88, pulse 94, temperature (!) 97.4 F (36.3 C), temperature source Oral, resp. rate 16, height 5\' 7"  (1.702 m), weight 73 kg, SpO2 95 %. 1. General:  in No Acute distress    Chronically ill  -appearing 2. Psychological: Alert and   Oriented 3. Head/ENT:     Dry Mucous Membranes                          Head Non traumatic, neck supple                            Poor Dentition                               NG in place to suction producing large amount of green to clear fluid 4. SKIN:   decreased Skin turgor,  Skin clean Dry and intact no rash 5. Heart: Regular rate and rhythm no Murmur, no Rub or gallop 6. Lungs: Clear to auscultation bilaterally, no wheezes or crackles   7. Abdomen: Soft, non-tender,   distended decreased bowel sounds present 8. Lower extremities:evidence of clubbing, cyanosis, no  edema 9. Neurologically Grossly intact, moving all 4 extremities equally  10. MSK: Normal range of motion   LABS:     Recent Labs  Lab 10/04/18 1824  WBC 7.0  HGB 12.1  HCT 38.2  MCV 91.8  PLT 462   Basic Metabolic Panel: Recent Labs  Lab 10/04/18 1824  NA 137  K 3.7  CL 103  CO2 23  GLUCOSE 114*  BUN 13  CREATININE 0.91  CALCIUM 9.2      Recent Labs  Lab 10/04/18 1824  AST 16  ALT 12  ALKPHOS 47  BILITOT 0.9  PROT 6.7  ALBUMIN 4.0   Recent Labs  Lab 10/04/18 1824  LIPASE 22   No results for input(s): AMMONIA in the last 168 hours.    HbA1C: No results for input(s): HGBA1C in the last 72 hours. CBG: No results for input(s): GLUCAP in the last 168 hours.    Urine analysis:    Component Value  Date/Time   COLORURINE YELLOW 08/28/2018 1248   APPEARANCEUR CLOUDY (A) 08/28/2018 1248   LABSPEC >1.046 (H) 08/28/2018 1248   PHURINE 5.0 08/28/2018 1248   GLUCOSEU NEGATIVE 08/28/2018 1248   HGBUR SMALL (A) 08/28/2018 1248   BILIRUBINUR NEGATIVE 08/28/2018 1248   KETONESUR NEGATIVE 08/28/2018 1248   PROTEINUR NEGATIVE 08/28/2018 1248   UROBILINOGEN 1.0 11/17/2012 1147   NITRITE NEGATIVE 08/28/2018 1248   LEUKOCYTESUR LARGE (A) 08/28/2018 1248       Cultures:    Component Value Date/Time   SDES URINE, CLEAN CATCH 11/17/2012 1147   SPECREQUEST NONE 11/17/2012 1147   CULT NO  GROWTH 11/17/2012 1147   REPTSTATUS 11/18/2012 FINAL 11/17/2012 1147     Radiological Exams on Admission: Ct Abdomen Pelvis W Contrast  Result Date: 10/04/2018 CLINICAL DATA:  73 year old female with colon cancer, most recent chemotherapy in January. Recurrent bowel obstructions. Abdominal pain now and no bowel movement in 2 days. EXAM: CT ABDOMEN AND PELVIS WITH CONTRAST TECHNIQUE: Multidetector CT imaging of the abdomen and pelvis was performed using the standard protocol following bolus administration of intravenous contrast. CONTRAST:  <See Chart> ISOVUE-300 IOPAMIDOL (ISOVUE-300) INJECTION 61%, <See Chart> OMNIPAQUE IOHEXOL 300 MG/ML SOLN COMPARISON:  CT Abdomen and Pelvis 08/27/2018 and earlier. FINDINGS: Lower chest: Stable mild scarring in both the medial basal segment of the left lower lobe and the lateral basal segment of the right lower lobe. No lung base nodule, pericardial effusion or pleural effusion. Hepatobiliary: Stable liver with multiple benign hepatic cysts suspected. The gallbladder is mildly larger but otherwise appears negative. Pancreas: Stable pancreatic atrophy. Spleen: Negative. Adrenals/Urinary Tract: Normal adrenal glands. Bilateral renal enhancement and contrast excretion is symmetric and normal. Ureters remain normal. Unremarkable urinary bladder. Stomach/Bowel: Retained stool in the rectum. Similar indistinct appearance of the walls of the sigmoid colon on series 2, image 65. This is near the small volume of free fluid in the pelvis. No change since February identified. The more upstream sigmoid and descending colon appear negative. Retained stool throughout the transverse and right colon. Persistent indistinct stranding throughout the mesentery from the mid transverse colon to the ascending colon (series 2, image 48). No associated mesenteric lymphadenopathy identified. The terminal ileum is decompressed but there are regional abnormal small bowel loops which appear matted  together and thickened in the distal ileum (coronal images 60 through 48. Upstream of this area several loops of small bowel are mildly dilated up to 3 centimeters. Trace associated free fluid among those loops. The configuration of these abnormal loops does not appear significantly changed since February. But upstream of those loops the small bowel is nondilated today. The stomach is mildly distended with fluid. No pneumoperitoneum identified. Vascular/Lymphatic: Aortoiliac calcified atherosclerosis. Major arterial structures are patent. Portal venous system is patent. Reproductive: Negative. Other: A small volume of pelvic fluid which appears to be in the distal large or small bowel mesentery on series 2, image 68 is not significantly changed from February. Musculoskeletal: Lower lumbar disc and endplate degeneration. No acute osseous abnormality identified. IMPRESSION: 1. Recurrent small bowel obstruction in the right lower quadrant where small bowel appears matted and tethered just upstream of the TI. The appearance somewhat resembles a closed loop obstruction given the lack of more generalized small bowel dilatation today, but I feel that is unlikely given the loop configuration in the RLQ is stable since February. Small volume of free fluid in the mesentery.  No free air. 2. Otherwise stable CT appearance of the abdomen and pelvis since  February. Electronically Signed   By: Genevie Ann M.D.   On: 10/04/2018 20:32    Chart has been reviewed    Assessment/Plan   74 y.o. female with medical history significant of Colon cancer, A.fib sp ablation in 2014, LEft Arm DVT on eliquis,   Stage IV ca carcinomatosis, SBO in the past  Admitted for SBO   Present on Admission:  SBO (small bowel obstruction) ([place]) -  Likely cause   adhesions, /malignancy   - admit for conservative management  - NG tube - NPO - KUB in AM -She is not interested in any surgical intervention would like to see if he can improve with  conservative management after this she would like to only concentrate on comfort care and hospice at home    H/o Atrial fibrillation ([place]) -           - CHA2DS2 vas score 3 : On Eliquis actually for history of DVT no longer on anticoagulation for atrial fibrillation currently on heparin sinus rhythm patient has been successfully converted in the past    HTN (hypertension) not on any home medications for this stable continue to monitor.  DVT (deep venous thrombosis) ([facility]) -while n.p.o. 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  Okay with IV fluids and NG tube while undergoing treatment for small bowel obstruction.  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  At this point patient strongly feels that after resolution of her small bowel obstruction she would like to transition to hospice She would like to avoid artificial feeding or IV fluids after she is discharged   Other plan as per orders.  DVT prophylaxis:  SCD    Code Status:   DNR/DNI  as per patient   I had personally discussed CODE STATUS with patient and family   Family Communication:   Family  at  Bedside  plan of care was discussed with  Son,    Disposition Plan:   To home once workup is complete and patient is stable                                       Palliative care    consulted          Consults called: none   Admission status:   inpatient     Expect 2 midnight stay secondary to severity of patient's current illness including      Severe lab/radiological/exam abnormalities including:  SBO    and extensive comorbidities including: malignancy,     Chronic anticoagulation  That are currently affecting medical management.   I expect  patient to be hospitalized for 2 midnights  requiring inpatient medical care.  Patient is at high risk for adverse outcome (such as loss of life or disability) if not treated.  Indication for inpatient stay as follows:  Severe change from baseline regarding mental status Hemodynamic instability despite maximal medical therapy,  ongoing suicidal ideations,  severe pain requiring acute inpatient management,  inability to maintain oral hydration   persistent chest pain despite medical management Need for operative/procedural  intervention New or worsening hypoxia  Need for IV antibiotics, IV fluids, IV rate controling medications, IV antihypertensives, IV pain medications  Level of care    medical floor           Toy Baker 10/04/2018, 10:36 PM     [facility]     after 2 AM please page floor coverage PA If 7AM-7PM, please contact the day team taking care of the patient using [email or web address]

## 2018-10-04 NOTE — ED Notes (Signed)
ED TO INPATIENT HANDOFF REPORT  ED Nurse Name and Phone #: Nila Nephew [phone]  S Name/Age/Gender Tonya Stanley 73 y.o. female Room/Bed: WA14/WA14  Code Status   Code Status: Prior  Home/SNF/Other Home Patient oriented to: self, place, time and situation Is this baseline? Yes   Triage Complete: Triage complete  Chief Complaint abdominal pain   Triage Note Pt states she has a hx of colon CA and SBO. Pt states that she fears she has another obstruction . Pt states she completed 12 week chemo 08/18/18. Pt states she had an obstruction last month. Pt reports abd pain in diaphragm area.   Allergies Allergies  Allergen Reactions  . Erythromycin Nausea And Vomiting  . Macrobid [Nitrofurantoin Macrocrystal] Nausea And Vomiting  . Polyethylene Glycol 3350     Other reaction(s): Other (See Comments) Total body spasms after high dose for colon prep. Well-tolerated in small doses subsequently.    Level of Care/Admitting Diagnosis ED Disposition    ED Disposition Condition [facility] Area: [place] [[id number]]  Level of Care: Med-Surg [16]  Diagnosis: SBO (small bowel obstruction) ([place]) [778242]  Admitting Physician: Toy Baker [[id number]]  Attending Physician: Toy Baker [[id number]]  Estimated length of stay: 3 - 4 days  Certification:: I certify this patient will need inpatient services for at least 2 midnights  PT Class (Do Not Modify): Inpatient [101]  PT Acc Code (Do Not Modify): Private [1]       B Medical/Surgery History Past Medical History:  Diagnosis Date  . Benign liver cysts 08/28/2018  . Cancer of sigmoid colon ([place]) 08/28/2018  . History of adenomatous polyp of colon 07/15/2017  . Liver lesion   . Paroxysmal atrial fibrillation ([place])    RFAblation 4/14 [facility]  . 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 Pulmonary embolism ([place])    post RFCA atrial fib  [facility]  4/14   Past Surgical History:  Procedure Laterality Date  . ABLATION OF DYSRHYTHMIC FOCUS  11/13/2012  . COLONOSCOPY    . DIAGNOSTIC LAPAROSCOPY  01/2018   Peritoneal carcinomatosis - Biopsied  . TONSILLECTOMY       A IV Location/Drains/Wounds Patient Lines/Drains/Airways Status   Active Line/Drains/Airways    Name:   Placement date:   Placement time:   Site:   Days:   Implanted Port 08/27/18   08/27/18    2222    -   38   NG/OG Tube Nasogastric 14 Fr. Left nare Aucultation Measured external length of tube   10/04/18    2102    Left nare   less than 1          Intake/Output Last 24 hours No intake or output data in the 24 hours ending 10/04/18 2202  Labs/Imaging Results for orders placed or performed during the hospital encounter of 10/04/18 (from the past 48 hour(s))  Lipase, blood     Status: None   Collection Time: 10/04/18  6:24 PM  Result Value Ref Range   Lipase 22 11 - 51 U/L    Comment: Performed at [facility], [place] [address], [place], [place] [address]  Comprehensive metabolic panel     Status: Abnormal   Collection Time: 10/04/18  6:24 PM  Result Value Ref Range   Sodium 137 135 - 145 mmol/L   Potassium 3.7 3.5 - 5.1 mmol/L   Chloride 103 98 - 111 mmol/L  CO2 23 22 - 32 mmol/L   Glucose, Bld 114 (H) 70 - 99 mg/dL   BUN 13 8 - 23 mg/dL   Creatinine, Ser 0.91 0.44 - 1.00 mg/dL   Calcium 9.2 8.9 - 10.3 mg/dL   Total Protein 6.7 6.5 - 8.1 g/dL   Albumin 4.0 3.5 - 5.0 g/dL   AST 16 15 - 41 U/L   ALT 12 0 - 44 U/L   Alkaline Phosphatase 47 38 - 126 U/L   Total Bilirubin 0.9 0.3 - 1.2 mg/dL   GFR calc non Af Amer >60 >60 mL/min   GFR calc Af Amer >60 >60 mL/min   Anion gap 11 5 - 15    Comment: Performed at [facility], [place] [address] [place], [place] [address]  CBC     Status: None   Collection Time: 10/04/18  6:24 PM  Result Value Ref Range   WBC 7.0 4.0 - 10.5 K/uL   RBC 4.16 3.87 - 5.11 MIL/uL   Hemoglobin  12.1 12.0 - 15.0 g/dL   HCT 38.2 36.0 - 46.0 %   MCV 91.8 80.0 - 100.0 fL   MCH 29.1 26.0 - 34.0 pg   MCHC 31.7 30.0 - 36.0 g/dL   RDW 13.7 11.5 - 15.5 %   Platelets 244 150 - 400 K/uL   nRBC 0.0 0.0 - 0.2 %    Comment: Performed at [facility], [place] [address], [place], [place] [address]   Ct Abdomen Pelvis W Contrast  Result Date: 10/04/2018 CLINICAL DATA:  73 year old female with colon cancer, most recent chemotherapy in January. Recurrent bowel obstructions. Abdominal pain now and no bowel movement in 2 days. EXAM: CT ABDOMEN AND PELVIS WITH CONTRAST TECHNIQUE: Multidetector CT imaging of the abdomen and pelvis was performed using the standard protocol following bolus administration of intravenous contrast. CONTRAST:  <See Chart> ISOVUE-300 IOPAMIDOL (ISOVUE-300) INJECTION 61%, <See Chart> OMNIPAQUE IOHEXOL 300 MG/ML SOLN COMPARISON:  CT Abdomen and Pelvis 08/27/2018 and earlier. FINDINGS: Lower chest: Stable mild scarring in both the medial basal segment of the left lower lobe and the lateral basal segment of the right lower lobe. No lung base nodule, pericardial effusion or pleural effusion. Hepatobiliary: Stable liver with multiple benign hepatic cysts suspected. The gallbladder is mildly larger but otherwise appears negative. Pancreas: Stable pancreatic atrophy. Spleen: Negative. Adrenals/Urinary Tract: Normal adrenal glands. Bilateral renal enhancement and contrast excretion is symmetric and normal. Ureters remain normal. Unremarkable urinary bladder. Stomach/Bowel: Retained stool in the rectum. Similar indistinct appearance of the walls of the sigmoid colon on series 2, image 65. This is near the small volume of free fluid in the pelvis. No change since February identified. The more upstream sigmoid and descending colon appear negative. Retained stool throughout the transverse and right colon. Persistent indistinct stranding throughout the mesentery from the mid transverse  colon to the ascending colon (series 2, image 48). No associated mesenteric lymphadenopathy identified. The terminal ileum is decompressed but there are regional abnormal small bowel loops which appear matted together and thickened in the distal ileum (coronal images 60 through 48. Upstream of this area several loops of small bowel are mildly dilated up to 3 centimeters. Trace associated free fluid among those loops. The configuration of these abnormal loops does not appear significantly changed since February. But upstream of those loops the small bowel is nondilated today. The stomach is mildly distended with fluid. No pneumoperitoneum identified. Vascular/Lymphatic: Aortoiliac calcified atherosclerosis. Major arterial structures are patent.  Portal venous system is patent. Reproductive: Negative. Other: A small volume of pelvic fluid which appears to be in the distal large or small bowel mesentery on series 2, image 68 is not significantly changed from February. Musculoskeletal: Lower lumbar disc and endplate degeneration. No acute osseous abnormality identified. IMPRESSION: 1. Recurrent small bowel obstruction in the right lower quadrant where small bowel appears matted and tethered just upstream of the TI. The appearance somewhat resembles a closed loop obstruction given the lack of more generalized small bowel dilatation today, but I feel that is unlikely given the loop configuration in the RLQ is stable since February. Small volume of free fluid in the mesentery.  No free air. 2. Otherwise stable CT appearance of the abdomen and pelvis since February. Electronically Signed   By: Genevie Ann M.D.   On: 10/04/2018 20:32   Dg Abd Portable 1 View  Result Date: 10/04/2018 CLINICAL DATA:  Encounter for NG tube placement EXAM: PORTABLE ABDOMEN - 1 VIEW COMPARISON:  None. FINDINGS: The tip and side port of a gastric tube is noted in the expected location of the gastric body. Contrast is seen both renal collecting  systems without obstruction. Moderate stool retention is seen within the. No significant small bowel dilatation is identified. IMPRESSION: Gastric tube is identified in the expected location of the stomach. No significant bowel distention. Moderate stool retention is noted within the colon. Electronically Signed   By: Ashley Royalty M.D.   On: 10/04/2018 21:36    Pending Labs Unresulted Labs (From admission, onward)    Start     Ordered   10/04/18 1824  Urinalysis, Routine w reflex microscopic  ONCE - STAT,   STAT     10/04/18 1823   Signed and Held  Magnesium  Tomorrow morning,   R    Comments:  Call MD if <1.5    Signed and Held   Signed and Held  Phosphorus  Tomorrow morning,   R     Signed and Held   Signed and Held  TSH  Once,   R    Comments:  Cancel if already done within 1 month and notify MD    Signed and Held   Signed and Held  Comprehensive metabolic panel  Once,   R    Comments:  Cal MD for K<3.5 or >5.0    Signed and Held   Signed and Held  CBC  Once,   R    Comments:  Call for hg <8.0    Signed and Held          Vitals/Pain Today's Vitals   10/04/18 2025 10/04/18 2030 10/04/18 2100 10/04/18 2130  BP:  118/73 (!) 155/88 133/66  Pulse:  83 94 83  Resp:  16 16 16   Temp:      TempSrc:      SpO2:  96% 95% 94%  Weight:      Height:      PainSc: 2        Isolation Precautions No active isolations  Medications Medications  iohexol (OMNIPAQUE) 300 MG/ML solution 100 mL ( Intravenous Canceled Entry 10/04/18 2004)  lidocaine (XYLOCAINE) 2 % viscous mouth solution (has no administration in time range)  sodium chloride flush (NS) 0.9 % injection 3 mL (3 mLs Intravenous Given 10/04/18 1903)  sodium chloride 0.9 % bolus 1,000 mL (0 mLs Intravenous Stopped 10/04/18 2025)  ondansetron (ZOFRAN) injection 4 mg (4 mg Intravenous Given 10/04/18 1858)  morphine 4 MG/ML injection  4 mg (4 mg Intravenous Given 10/04/18 1858)  iopamidol (ISOVUE-300) 61 % injection 100 mL (100 mLs  Intravenous Contrast Given 10/04/18 2004)    Mobility walks Low fall risk   Focused Assessments GI ASSESSMENT   R Recommendations: See Admitting Provider Note  Report given to:   Additional Notes:

## 2018-10-04 NOTE — ED Triage Notes (Signed)
Pt states she has a hx of colon CA and SBO. Pt states that she fears she has another obstruction . Pt states she completed 12 week chemo 08/18/18. Pt states she had an obstruction last month. Pt reports abd pain in diaphragm area.

## 2018-10-04 NOTE — ED Provider Notes (Signed)
[facility] DEPT Provider Note   CSN: [id number] Arrival date & time: 10/04/18  1811    History   Chief Complaint Chief Complaint  Patient presents with   Abdominal Pain    HPI AURORE REDINGER is a 73 y.o. female.     Pt presents to the ED today with abdominal pain.  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  The pt has had several SBO and feels like she has one again.  She has not had a bowel movement in 2 days.  She said she is scheduled for a diagnostic laparoscopic procedure to determine if she's eligible HIPEC.  The pt said she feels like she just needs hospice and to be made a DNR.  She is tired of hurting all the time.      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   Dx laparoscopy Dr Rocco Pauls in [place], [place]   Pulmonary embolism [facility])    post RFCA atrial fib  [facility] 4/14    Patient Active Problem List   Diagnosis Date Noted   DVT (deep venous thrombosis) ([place]) 08/28/2018   Benign liver cysts 08/28/2018   Current use of long term anticoagulation 08/28/2018   Cancer of sigmoid colon ([place]) 08/28/2018   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) 10/01/2017   Nontoxic multinodular  goiter 09/14/2016   Vitamin D deficiency 09/14/2016   H/o Atrial fibrillation ([place]) 06/28/2012    Past Surgical History:  Procedure Laterality Date   ABLATION OF DYSRHYTHMIC FOCUS  11/13/2012   COLONOSCOPY     DIAGNOSTIC LAPAROSCOPY  01/2018   Peritoneal carcinomatosis - Biopsied   TONSILLECTOMY       OB History   No obstetric history on file.      Home Medications    Prior to Admission medications   Medication Sig Start Date End Date Taking? Authorizing Provider  albuterol (VENTOLIN HFA) 108 (90 Base) MCG/ACT inhaler Inhale 2 puffs into the lungs every morning. 09/21/17  Yes [provider]  apixaban (ELIQUIS) 5 MG TABS tablet Take 5 mg by mouth 2 (two) times daily.   Yes [provider]  pantoprazole (PROTONIX) 40 MG tablet Take 40 mg by mouth daily.  04/03/18 04/03/19 Yes [provider]  bisacodyl (DULCOLAX) 10 MG suppository Place 1 suppository (10 mg total) rectally daily. Patient not taking: Reported on 10/04/2018 08/30/18   Reyne Dumas, MD  ondansetron (ZOFRAN) 4 MG tablet Take 1 tablet (4 mg total) by mouth daily as needed for nausea or vomiting. Patient not taking: Reported on 10/04/2018 08/30/18 08/30/19  Reyne Dumas, MD  polyethylene glycol (MIRALAX / GLYCOLAX) packet Take 17 g by mouth 2 (two) times daily. Patient not taking: Reported on 10/04/2018 08/30/18   Reyne Dumas, MD  Family History Family History  Problem Relation Age of Onset   Hypertension Other     Social History Social History   Tobacco Use   Smoking status: Never Smoker   Smokeless tobacco: Never Used  Substance Use Topics   Alcohol use: No   Drug use: No     Allergies   Erythromycin; Macrobid [nitrofurantoin macrocrystal]; and Polyethylene glycol 3350   Review of Systems Review of Systems  Gastrointestinal: Positive for abdominal pain, nausea and vomiting.  All other systems reviewed and are negative.    Physical Exam Updated Vital Signs BP  133/75 (BP Location: Right Arm)    Pulse 73    Temp 97.8 F (36.6 C) (Oral)    Resp 16    Ht 5\' 7"  (1.702 m)    Wt 72 kg    SpO2 95%    BMI 24.86 kg/m   Physical Exam Vitals signs and nursing note reviewed.  Constitutional:      Appearance: She is well-developed.  HENT:     Head: Normocephalic and atraumatic.     Mouth/Throat:     Mouth: Mucous membranes are moist.  Eyes:     Extraocular Movements: Extraocular movements intact.     Pupils: Pupils are equal, round, and reactive to light.  Cardiovascular:     Rate and Rhythm: Normal rate and regular rhythm.  Pulmonary:     Effort: Pulmonary effort is normal.     Breath sounds: Normal breath sounds.  Abdominal:     General: Bowel sounds are decreased. There is distension.     Palpations: Abdomen is soft.     Tenderness: There is generalized abdominal tenderness.  Skin:    General: Skin is warm.     Capillary Refill: Capillary refill takes less than 2 seconds.  Neurological:     General: No focal deficit present.     Mental Status: She is alert and oriented to person, place, and time.  Psychiatric:        Mood and Affect: Mood normal.        Behavior: Behavior normal.      ED Treatments / Results  Labs (all labs ordered are listed, but only abnormal results are displayed) Labs Reviewed  COMPREHENSIVE METABOLIC PANEL - Abnormal; Notable for the following components:      Result Value   Glucose, Bld 114 (*)    All other components within normal limits  URINALYSIS, ROUTINE W REFLEX MICROSCOPIC - Abnormal; Notable for the following components:   Specific Gravity, Urine 1.042 (*)    Ketones, ur 20 (*)    Leukocytes,Ua SMALL (*)    All other components within normal limits  HEPARIN LEVEL (UNFRACTIONATED) - Abnormal; Notable for the following components:   Heparin Unfractionated 1.10 (*)    All other components within normal limits  CBC - Abnormal; Notable for the following components:   RBC 3.72 (*)    Hemoglobin 10.8 (*)     HCT 35.2 (*)    All other components within normal limits  TSH - Abnormal; Notable for the following components:   TSH 0.024 (*)    All other components within normal limits  COMPREHENSIVE METABOLIC PANEL - Abnormal; Notable for the following components:   Calcium 8.5 (*)    Total Protein 5.5 (*)    Albumin 3.2 (*)    AST 14 (*)    All other components within normal limits  LIPASE, BLOOD  CBC  APTT  MAGNESIUM  PHOSPHORUS  APTT  EKG None  Radiology Ct Abdomen Pelvis W Contrast  Result Date: 10/04/2018 CLINICAL DATA:  73 year old female with colon cancer, most recent chemotherapy in January. Recurrent bowel obstructions. Abdominal pain now and no bowel movement in 2 days. EXAM: CT ABDOMEN AND PELVIS WITH CONTRAST TECHNIQUE: Multidetector CT imaging of the abdomen and pelvis was performed using the standard protocol following bolus administration of intravenous contrast. CONTRAST:  <See Chart> ISOVUE-300 IOPAMIDOL (ISOVUE-300) INJECTION 61%, <See Chart> OMNIPAQUE IOHEXOL 300 MG/ML SOLN COMPARISON:  CT Abdomen and Pelvis 08/27/2018 and earlier. FINDINGS: Lower chest: Stable mild scarring in both the medial basal segment of the left lower lobe and the lateral basal segment of the right lower lobe. No lung base nodule, pericardial effusion or pleural effusion. Hepatobiliary: Stable liver with multiple benign hepatic cysts suspected. The gallbladder is mildly larger but otherwise appears negative. Pancreas: Stable pancreatic atrophy. Spleen: Negative. Adrenals/Urinary Tract: Normal adrenal glands. Bilateral renal enhancement and contrast excretion is symmetric and normal. Ureters remain normal. Unremarkable urinary bladder. Stomach/Bowel: Retained stool in the rectum. Similar indistinct appearance of the walls of the sigmoid colon on series 2, image 65. This is near the small volume of free fluid in the pelvis. No change since February identified. The more upstream sigmoid and descending  colon appear negative. Retained stool throughout the transverse and right colon. Persistent indistinct stranding throughout the mesentery from the mid transverse colon to the ascending colon (series 2, image 48). No associated mesenteric lymphadenopathy identified. The terminal ileum is decompressed but there are regional abnormal small bowel loops which appear matted together and thickened in the distal ileum (coronal images 60 through 48. Upstream of this area several loops of small bowel are mildly dilated up to 3 centimeters. Trace associated free fluid among those loops. The configuration of these abnormal loops does not appear significantly changed since February. But upstream of those loops the small bowel is nondilated today. The stomach is mildly distended with fluid. No pneumoperitoneum identified. Vascular/Lymphatic: Aortoiliac calcified atherosclerosis. Major arterial structures are patent. Portal venous system is patent. Reproductive: Negative. Other: A small volume of pelvic fluid which appears to be in the distal large or small bowel mesentery on series 2, image 68 is not significantly changed from February. Musculoskeletal: Lower lumbar disc and endplate degeneration. No acute osseous abnormality identified. IMPRESSION: 1. Recurrent small bowel obstruction in the right lower quadrant where small bowel appears matted and tethered just upstream of the TI. The appearance somewhat resembles a closed loop obstruction given the lack of more generalized small bowel dilatation today, but I feel that is unlikely given the loop configuration in the RLQ is stable since February. Small volume of free fluid in the mesentery.  No free air. 2. Otherwise stable CT appearance of the abdomen and pelvis since February. Electronically Signed   By: Genevie Ann M.D.   On: 10/04/2018 20:32   Dg Abd Portable 1 View  Result Date: 10/04/2018 CLINICAL DATA:  Encounter for NG tube placement EXAM: PORTABLE ABDOMEN - 1 VIEW  COMPARISON:  None. FINDINGS: The tip and side port of a gastric tube is noted in the expected location of the gastric body. Contrast is seen both renal collecting systems without obstruction. Moderate stool retention is seen within the. No significant small bowel dilatation is identified. IMPRESSION: Gastric tube is identified in the expected location of the stomach. No significant bowel distention. Moderate stool retention is noted within the colon. Electronically Signed   By: Ashley Royalty M.D.   On:  10/04/2018 21:36    Procedures Procedures (including critical care time)  Medications Ordered in ED Medications  iohexol (OMNIPAQUE) 300 MG/ML solution 100 mL ( Intravenous Canceled Entry 10/04/18 2004)  acetaminophen (TYLENOL) tablet 650 mg (has no administration in time range)    Or  acetaminophen (TYLENOL) suppository 650 mg (has no administration in time range)  ondansetron (ZOFRAN) tablet 4 mg (has no administration in time range)    Or  ondansetron (ZOFRAN) injection 4 mg (has no administration in time range)  0.9 %  sodium chloride infusion ( Intravenous Duplicate 11/19/04 2376)  morphine 2 MG/ML injection 2-4 mg (2 mg Intravenous Given 10/04/18 2335)  albuterol (PROVENTIL) (2.5 MG/3ML) 0.083% nebulizer solution 2.5 mg (has no administration in time range)  heparin ADULT infusion 100 units/mL (25000 units/289mL sodium chloride 0.45%) (1,300 Units/hr Intravenous Rate/Dose Change 10/05/18 1044)  sodium chloride flush (NS) 0.9 % injection 10-40 mL (has no administration in time range)  sodium chloride flush (NS) 0.9 % injection 3 mL (3 mLs Intravenous Given 10/04/18 1903)  sodium chloride 0.9 % bolus 1,000 mL (0 mLs Intravenous Stopped 10/04/18 2025)  ondansetron (ZOFRAN) injection 4 mg (4 mg Intravenous Given 10/04/18 1858)  morphine 4 MG/ML injection 4 mg (4 mg Intravenous Given 10/04/18 1858)  iopamidol (ISOVUE-300) 61 % injection 100 mL (100 mLs Intravenous Contrast Given 10/04/18 2004)    lidocaine (XYLOCAINE) 2 % viscous mouth solution (0 mLs  Duplicate 2/83/15 1761)     Initial Impression / Assessment and Plan / ED Course  I have reviewed the triage vital signs and the nursing notes.  Pertinent labs & imaging results that were available during my care of the patient were reviewed by me and considered in my medical decision making (see chart for details).       Pt is feeling much better, but she does have a recurrent SBO.  The pt is willing to try the NG 1 more time, but is not interested in surgery.  She just wants her pain controlled.  She does want hospice to see her this admission.  She also wants to be DNR.  Pt d/w Dr. Roel Cluck (triad) for admission.  Final Clinical Impressions(s) / ED Diagnoses   Final diagnoses:  Small bowel obstruction ([place])  Carcinomatosis [facility])    ED Discharge Orders    None       Isla Pence, MD 10/05/18 1524

## 2018-10-05 ENCOUNTER — Other Ambulatory Visit: Payer: Self-pay

## 2018-10-05 DIAGNOSIS — C801 Malignant (primary) neoplasm, unspecified: Secondary | ICD-10-CM

## 2018-10-05 DIAGNOSIS — C786 Secondary malignant neoplasm of retroperitoneum and peritoneum: Principal | ICD-10-CM

## 2018-10-05 DIAGNOSIS — Z7189 Other specified counseling: Secondary | ICD-10-CM

## 2018-10-05 DIAGNOSIS — Z515 Encounter for palliative care: Secondary | ICD-10-CM

## 2018-10-05 LAB — URINALYSIS, ROUTINE W REFLEX MICROSCOPIC
Bacteria, UA: NONE SEEN
Bilirubin Urine: NEGATIVE
Glucose, UA: NEGATIVE mg/dL
Hgb urine dipstick: NEGATIVE
Ketones, ur: 20 mg/dL — AB
Nitrite: NEGATIVE
Protein, ur: NEGATIVE mg/dL
Specific Gravity, Urine: 1.042 — ABNORMAL HIGH (ref 1.005–1.030)
pH: 5 (ref 5.0–8.0)

## 2018-10-05 LAB — CBC
HCT: 35.2 % — ABNORMAL LOW (ref 36.0–46.0)
Hemoglobin: 10.8 g/dL — ABNORMAL LOW (ref 12.0–15.0)
MCH: 29 pg (ref 26.0–34.0)
MCHC: 30.7 g/dL (ref 30.0–36.0)
MCV: 94.6 fL (ref 80.0–100.0)
Platelets: 226 10*3/uL (ref 150–400)
RBC: 3.72 MIL/uL — ABNORMAL LOW (ref 3.87–5.11)
RDW: 13.6 % (ref 11.5–15.5)
WBC: 4.2 10*3/uL (ref 4.0–10.5)
nRBC: 0 % (ref 0.0–0.2)

## 2018-10-05 LAB — COMPREHENSIVE METABOLIC PANEL
ALK PHOS: 42 U/L (ref 38–126)
ALT: 11 U/L (ref 0–44)
AST: 14 U/L — ABNORMAL LOW (ref 15–41)
Albumin: 3.2 g/dL — ABNORMAL LOW (ref 3.5–5.0)
Anion gap: 8 (ref 5–15)
BILIRUBIN TOTAL: 0.5 mg/dL (ref 0.3–1.2)
BUN: 11 mg/dL (ref 8–23)
CO2: 26 mmol/L (ref 22–32)
Calcium: 8.5 mg/dL — ABNORMAL LOW (ref 8.9–10.3)
Chloride: 104 mmol/L (ref 98–111)
Creatinine, Ser: 0.71 mg/dL (ref 0.44–1.00)
GFR calc Af Amer: >60 mL/min (ref >60)
GFR calc non Af Amer: >60 mL/min (ref >60)
Glucose, Bld: 83 mg/dL (ref 70–99)
Potassium: 3.8 mmol/L (ref 3.5–5.1)
Sodium: 138 mmol/L (ref 135–145)
TOTAL PROTEIN: 5.5 g/dL — AB (ref 6.5–8.1)

## 2018-10-05 LAB — HEPARIN LEVEL (UNFRACTIONATED): Heparin Unfractionated: 1.1 IU/mL — ABNORMAL HIGH (ref 0.30–0.70)

## 2018-10-05 LAB — APTT
APTT: 32 s (ref 24–36)
APTT: 106 s — AB (ref 24–36)

## 2018-10-05 LAB — MAGNESIUM: Magnesium: 2.3 mg/dL (ref 1.7–2.4)

## 2018-10-05 LAB — PHOSPHORUS: Phosphorus: 4.2 mg/dL (ref 2.5–4.6)

## 2018-10-05 LAB — TSH: TSH: 0.024 u[IU]/mL — ABNORMAL LOW (ref 0.350–4.500)

## 2018-10-05 MED ORDER — BISACODYL 10 MG RE SUPP
10.0000 mg | Freq: Once | RECTAL | Status: AC
  Administered 2018-10-06: 10 mg via RECTAL
  Filled 2018-10-05: qty 1

## 2018-10-05 MED ORDER — SODIUM CHLORIDE 0.9% FLUSH
10.0000 mL | INTRAVENOUS | Status: DC | PRN
  Administered 2018-10-07: 10 mL
  Filled 2018-10-05: qty 40

## 2018-10-05 NOTE — Progress Notes (Signed)
Patient prefers Compazine instead of Zofran.  Notified MD.

## 2018-10-05 NOTE — Consult Note (Signed)
Palliative Care Consult Note  Reason for Consult: Goals of care in light of metastatic cancer  Palliative medicine consult received.  Chart reviewed including personal review of pertinent labs and imaging.  I met today with Tonya Stanley in conjunction with liaison from [facility] hospice. We discussed clinical course as well as wishes moving forward in regard to  care this hospitalization and following discharge  We discussed difference between a aggressive medical intervention path and a palliative, comfort focused care path.  Values and goals of care important to patient and family were attempted to be elicited.  Tonya Stanley reports that she has had continued decline in her quality of life and is at a point where she is no longer interested in recurrent hospitalizations for treatment of her recurrent small bowel obstruction secondary to her metastatic cancer.  She states that her goal moving forward is to finish this hospital course and then transition home with hospice support.    She had many questions regarding care plan moving forward and I discussed these in detail to the best of my ability.  We discussed likely care plan, prognosis, and potential care issues in the future including working to continue to control her symptoms of pain and nausea with bowel obstruction as well as her concerns for complications such as perforation and what management options at home may look like through the assistance of hospice.  At the end of the conversation, she reports that she is agreeable to discharging home with home hospice services when she is medically maximized.  We discussed her current symptoms and she reports that she is not currently having nausea or pain.  I did review CT scan as well as abdominal x-ray and it does show that she has been having stool burden and predominantly right colon as well as rectal vault.  She states it is been several days since her last bowel movement.  Chart review of last  hospitalization reveals that Dulcolax suppository was effective in relieving constipation.  I called and discussed with Dr. Sherrian Divers and we will plan for trial of Dulcolax suppository today.  Once she is having movement of stool, would recommend addition of osmotic laxative(Either lactulose or MiraLAX) as well as increasing amount of Colace with addition of Senokot.  This is certainly something to continue to be managed by hospice when she discharges home.  Questions and concerns addressed.   PMT will continue to support holistically.  - Plan for suppository - Home with home hospice once medically maximized. On discharge, would recommend scripts for: - Morphine Concentrate 43m/0.5ml: 286m(29m52msublingual every 1 hour as needed for pain or shortness of breath. She already has supply at home. - Lorazepam 2mg86m concentrated solution: 29mg 69m5ml) 62mlingual every 4 hours as needed for anxiety: Disp 30ml -48mdol 2mg/ml 65mution: 0.5mg (0.242m) sub53mual every 4 hours as needed for agitation or nausea: Disp 30ml   Tot67mime: 60 minutes Greater than 50%  of this time was spent counseling and coordinating care related to the above assessment and plan.  Armel Rabbani FreemaMicheline [phone]

## 2018-10-05 NOTE — TOC Initial Note (Signed)
Transition of Care [facility]) - Initial/Assessment Note    Patient Details  Name: Tonya Stanley MRN: [id number] Date of Birth: 1945/08/23  Transition [facility]) CM/SW Contact:    Dessa Phi, RN Phone Number: 10/05/2018, 3:34 PM  Clinical Narrative: Stge 4 Ca carcinomatosis. DNR.From home w/spouse. Patient wants to go home w/home hospice-she has already called [facility]-CM has spoken to rep Anderson Malta from [facility] about self referral. Palliative Care cons-await recc.Patient currently has NGT. Patient states spouse will transport her home @ d/c.                 Expected Discharge Plan: Home w Hospice Care Barriers to Discharge: No Barriers Identified   Patient Goals and CMS Choice Patient states their goals for this hospitalization and ongoing recovery are:: going home CMS Medicare.gov Compare Post Acute Care list provided to:: Patient Choice offered to / list presented to : Patient  Expected Discharge Plan and Services Expected Discharge Plan: [place] arrangements for the past 2 months: Single Family Home Expected Discharge Date: (unknown)                   HH Arranged: RN Converse Agency: Hospice and [place]  Prior Living Arrangements/Services Living arrangements for the past 2 months: Single Family Home Lives with:: Spouse   Do you feel safe going back to the place where you live?: Yes               Activities of Daily Living Home Assistive Devices/Equipment: Eyeglasses ADL Screening (condition at time of admission) Patient's cognitive ability adequate to safely complete daily activities?: Yes Is the patient deaf or have difficulty hearing?: No Does the patient have difficulty seeing, even when wearing glasses/contacts?: No Does the patient have difficulty concentrating, remembering, or making decisions?: No Patient able to express need for assistance with ADLs?: Yes Does the patient have difficulty dressing or  bathing?: No Independently performs ADLs?: Yes (appropriate for developmental age) Does the patient have difficulty walking or climbing stairs?: No Weakness of Legs: None Weakness of Arms/Hands: None  Permission Sought/Granted Permission sought to share information with : Case Manager Permission granted to share information with : Yes, Verbal Permission Granted              Emotional Assessment Appearance:: Appears stated age, Well-Groomed Attitude/Demeanor/Rapport: Gracious Affect (typically observed): Accepting Orientation: : Oriented to Self, Oriented to Place, Oriented to  Time, Oriented to Situation      Admission diagnosis:  Small bowel obstruction ([place]) [K56.609] Carcinomatosis ([place]) [C80.0] Patient Active Problem List   Diagnosis Date Noted  . DVT (deep venous thrombosis) ([place]) 08/28/2018  . Benign liver cysts 08/28/2018  . Current use of long term anticoagulation 08/28/2018  . Cancer of sigmoid colon ([place]) 08/28/2018  . 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 Maintenance antineoplastic chemotherapy July 2019 - (FOLFOX, Avastin) 08/28/2018  . History of pulmonary embolus (PE) 2014   . Hoarseness 07/11/2018  . Goiter 07/11/2018  . Restrictive lung disease 07/11/2018  . 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 Constipation 01/03/2018  . HTN (hypertension) 10/02/2017  . Enteritis 10/02/2017  . SBO (small bowel obstruction) ([place]) 10/01/2017  . Nontoxic multinodular goiter 09/14/2016  . Vitamin D deficiency 09/14/2016  . H/o Atrial fibrillation ([place]) 06/28/2012   PCP:  Kathyrn Lass, MD Pharmacy:   [place], [place] -  [address] [place] [address] Phone: [phone] Fax: [phone]     Social Determinants of Health (SDOH) Interventions    Readmission Risk Interventions 30 Day Unplanned Readmission Risk Score      ED to Hosp-Admission (Current) from 10/04/2018 in [place]  30 Day Unplanned Readmission Risk Score (%)  19 Filed at 10/05/2018 1200     This score is the patient's risk of an unplanned readmission within 30 days of being discharged (0 -100%). The score is based on dignosis, age, lab data, medications, orders, and past utilization.   Low:  0-14.9   Medium: 15-21.9   High: 22-29.9   Extreme: 30 and above       No flowsheet data found.

## 2018-10-05 NOTE — Progress Notes (Signed)
PROGRESS NOTE    Tonya Stanley  CNO:[id number] DOB: 09-11-1945 DOA: 10/04/2018 PCP: Kathyrn Lass, MD    Brief Narrative:  73 y.o. female with medical history significant of Colon cancer, A.fib sp ablation in 2014, LEft Arm DVT on eliquis,   Stage IV ca carcinomatosis, SBO in the past   Presented with  Abdominal pain 2day similar to prior SBO, no nausea have not had a bowel movement in 2 days patient stopped eating so that she could have her symptoms improved but developed significant abdominal pain and presented to emergency department.  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 currently on anticoagulation  Assessment & Plan:   Active Problems:   H/o Atrial fibrillation (HCC)   SBO (small bowel obstruction) (HCC)   HTN (hypertension)   Restrictive lung disease   DVT (deep venous thrombosis) (HCC)   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 Moderate stool in colon -She is not interested in any surgical intervention would like to see if he can improve with conservative management after this she would like to only concentrate on comfort care and hospice at home - Pos BS. Will give trial of clamping NG and trial of clears. Advance as tolerated   H/o Atrial fibrillation ([place]) -   - CHA2DS2 vas score 3 : On Eliquis actually for history of DVT no longer on anticoagulation for atrial fibrillation currently on heparin sinus rhythm patient has been successfully converted in the past -Hemodynamically stable    HTN (hypertension) not on any home medications for this stable continue to monitor. 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  Okay with IV fluids and NG tube while undergoing treatment for small bowel obstruction. 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  At this point patient strongly feels that after resolution of her small bowel obstruction she would like to transition to hospice She would like to avoid artificial feeding or IV fluids after she is discharged -Currently stable  DVT prophylaxis: Heparin gtt Code Status: DNR Family Communication: Pt in room, family not at bedside Disposition Plan: Uncertain at this time  Consultants:     Procedures:     Antimicrobials: Anti-infectives (From admission, onward)   None       Subjective: Reports feeling better. Eager to start clears  Objective: Vitals:   10/04/18 2246 10/04/18 2300 10/05/18 0536 10/05/18 1322  BP: (!) 143/83  113/68 133/75  Pulse: 80  70 73  Resp: 16  16 16  Temp: 98.3 F (36.8 C)  98 F (36.7 C) 97.8 F (36.6 C)  TempSrc: Oral  Oral Oral  SpO2: 94%  92% 95%  Weight:  72 kg    Height:  5\' 7"  (1.702 m)      Intake/Output Summary (Last 24 hours) at 10/05/2018 1612 Last data filed at 10/05/2018 1326 Gross per 24 hour  Intake 639.82 ml  Output 800 ml  Net -160.18 ml   Filed Weights   10/04/18 1823 10/04/18 2300  Weight:  73 kg 72 kg    Examination:  General exam: Appears calm and comfortable  Respiratory system: Clear to auscultation. Respiratory effort normal. Cardiovascular system: S1 & S2 heard, RRR Gastrointestinal system: Abdomen is nondistended, soft and nontender. No organomegaly or masses felt. Normal bowel sounds heard. Central nervous system: Alert and oriented. No focal neurological deficits. Extremities: Symmetric 5 x 5 power. Skin: No rashes, lesions  Psychiatry: Judgement and insight appear normal. Mood & affect appropriate.   Data Reviewed: I have personally reviewed following labs and imaging studies  CBC: Recent Labs  Lab 10/04/18 1824 10/05/18 0415  WBC 7.0 4.2  HGB 12.1 10.8*  HCT 38.2 35.2*  MCV 91.8 94.6  PLT 244 875   Basic Metabolic Panel: Recent Labs  Lab 10/04/18 1824 10/05/18 0415  NA 137 138  K 3.7 3.8  CL 103 104  CO2 23 26  GLUCOSE 114* 83  BUN 13 11  CREATININE 0.91 0.71  CALCIUM 9.2 8.5*  MG  --  2.3  PHOS  --  4.2   GFR: Estimated Creatinine Clearance: 62.7 mL/min (by C-G formula based on SCr of 0.71 mg/dL). Liver Function Tests: Recent Labs  Lab 10/04/18 1824 10/05/18 0415  AST 16 14*  ALT 12 11  ALKPHOS 47 42  BILITOT 0.9 0.5  PROT 6.7 5.5*  ALBUMIN 4.0 3.2*   Recent Labs  Lab 10/04/18 1824  LIPASE 22   No results for input(s): AMMONIA in the last 168 hours. Coagulation Profile: No results for input(s): INR, PROTIME in the last 168 hours. Cardiac Enzymes: No results for input(s): CKTOTAL, CKMB, CKMBINDEX, TROPONINI in the last 168 hours. BNP (last 3 results) No results for input(s): PROBNP in the last 8760 hours. HbA1C: No results for input(s): HGBA1C in the last 72 hours. CBG: No results for input(s): GLUCAP in the last 168 hours. Lipid Profile: No results for input(s): CHOL, HDL, LDLCALC, TRIG, CHOLHDL, LDLDIRECT in the last 72 hours. Thyroid Function Tests: Recent Labs    10/05/18 0415  TSH 0.024*   Anemia Panel: No  results for input(s): VITAMINB12, FOLATE, FERRITIN, TIBC, IRON, RETICCTPCT in the last 72 hours. Sepsis Labs: No results for input(s): PROCALCITON, LATICACIDVEN in the last 168 hours.  No results found for this or any previous visit (from the past 240 hour(s)).   Radiology Studies: Ct Abdomen Pelvis W Contrast  Result Date: 10/04/2018 CLINICAL DATA:  73 year old female with colon cancer, most recent chemotherapy in January. Recurrent bowel obstructions. Abdominal pain now and no bowel movement in 2 days. EXAM: CT ABDOMEN AND PELVIS WITH CONTRAST TECHNIQUE: Multidetector CT imaging of the abdomen and pelvis was performed using the standard protocol following bolus administration of intravenous contrast. CONTRAST:  <See Chart> ISOVUE-300 IOPAMIDOL (ISOVUE-300) INJECTION 61%, <See Chart> OMNIPAQUE IOHEXOL 300 MG/ML SOLN COMPARISON:  CT Abdomen and Pelvis 08/27/2018 and earlier. FINDINGS: Lower chest: Stable mild scarring in both the medial basal segment of the left lower lobe and the lateral basal segment of the  right lower lobe. No lung base nodule, pericardial effusion or pleural effusion. Hepatobiliary: Stable liver with multiple benign hepatic cysts suspected. The gallbladder is mildly larger but otherwise appears negative. Pancreas: Stable pancreatic atrophy. Spleen: Negative. Adrenals/Urinary Tract: Normal adrenal glands. Bilateral renal enhancement and contrast excretion is symmetric and normal. Ureters remain normal. Unremarkable urinary bladder. Stomach/Bowel: Retained stool in the rectum. Similar indistinct appearance of the walls of the sigmoid colon on series 2, image 65. This is near the small volume of free fluid in the pelvis. No change since February identified. The more upstream sigmoid and descending colon appear negative. Retained stool throughout the transverse and right colon. Persistent indistinct stranding throughout the mesentery from the mid transverse colon to the ascending colon  (series 2, image 48). No associated mesenteric lymphadenopathy identified. The terminal ileum is decompressed but there are regional abnormal small bowel loops which appear matted together and thickened in the distal ileum (coronal images 60 through 48. Upstream of this area several loops of small bowel are mildly dilated up to 3 centimeters. Trace associated free fluid among those loops. The configuration of these abnormal loops does not appear significantly changed since February. But upstream of those loops the small bowel is nondilated today. The stomach is mildly distended with fluid. No pneumoperitoneum identified. Vascular/Lymphatic: Aortoiliac calcified atherosclerosis. Major arterial structures are patent. Portal venous system is patent. Reproductive: Negative. Other: A small volume of pelvic fluid which appears to be in the distal large or small bowel mesentery on series 2, image 68 is not significantly changed from February. Musculoskeletal: Lower lumbar disc and endplate degeneration. No acute osseous abnormality identified. IMPRESSION: 1. Recurrent small bowel obstruction in the right lower quadrant where small bowel appears matted and tethered just upstream of the TI. The appearance somewhat resembles a closed loop obstruction given the lack of more generalized small bowel dilatation today, but I feel that is unlikely given the loop configuration in the RLQ is stable since February. Small volume of free fluid in the mesentery.  No free air. 2. Otherwise stable CT appearance of the abdomen and pelvis since February. Electronically Signed   By: Genevie Ann M.D.   On: 10/04/2018 20:32   Dg Abd Portable 1 View  Result Date: 10/04/2018 CLINICAL DATA:  Encounter for NG tube placement EXAM: PORTABLE ABDOMEN - 1 VIEW COMPARISON:  None. FINDINGS: The tip and side port of a gastric tube is noted in the expected location of the gastric body. Contrast is seen both renal collecting systems without obstruction.  Moderate stool retention is seen within the. No significant small bowel dilatation is identified. IMPRESSION: Gastric tube is identified in the expected location of the stomach. No significant bowel distention. Moderate stool retention is noted within the colon. Electronically Signed   By: Ashley Royalty M.D.   On: 10/04/2018 21:36    Scheduled Meds: Continuous Infusions:  heparin 1,300 Units/hr (10/05/18 1044)     LOS: 1 day   Marylu Lund, MD [facility] Pager On Amion  If 7PM-7AM, please contact night-coverage 10/05/2018, 4:12 PM

## 2018-10-05 NOTE — Progress Notes (Signed)
[facility])  Received referral from [facility] referral center, pt had called herself requesting hospice services at home.  Met with pt in her room.  She was tearful and wanted answers about what to expect when she goes home, pain management, trajectory of disease.  She has many questions and would like to be prepared.  She advises me she will be discharging at 10am on 3/13 back to home and would like hospice support.  She also states she never wants to return to the hospital, but to focus on comfort and dignity.  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  PMT will be meeting with her today, and [facility] will be there to assist if needed.    Updated RN Case Manager about pts self referral.  Thank you, Venia Carbon BSN, RN [facility] Liaison  [phone]

## 2018-10-05 NOTE — Progress Notes (Signed)
ANTICOAGULATION CONSULT NOTE - Initial Consult  Pharmacy Consult for Heparin (bridge apixaban) Indication: DVT  Allergies  Allergen Reactions  . Erythromycin Nausea And Vomiting  . Macrobid [Nitrofurantoin Macrocrystal] Nausea And Vomiting  . Polyethylene Glycol 3350     Other reaction(s): Other (See Comments) Total body spasms after high dose for colon prep. Well-tolerated in small doses subsequently.    Patient Measurements: Height: 5\' 7"  (170.2 cm) Weight: 158 lb 11.7 oz (72 kg) IBW/kg (Calculated) : 61.6 Heparin Dosing Weight:   Vital Signs: Temp: 98 F (36.7 C) (03/12 0536) Temp Source: Oral (03/12 0536) BP: 113/68 (03/12 0536) Pulse Rate: 70 (03/12 0536)  Labs: Recent Labs    10/04/18 1824 10/05/18 0415 10/05/18 0803  HGB 12.1 10.8*  --   HCT 38.2 35.2*  --   PLT 244 226  --   APTT  --   --  32  HEPARINUNFRC  --   --  1.10*  CREATININE 0.91 0.71  --     Estimated Creatinine Clearance: 62.7 mL/min (by C-G formula based on SCr of 0.71 mg/dL).   Medical History: Past Medical History:  Diagnosis Date  . Benign liver cysts 08/28/2018  . Cancer of sigmoid colon ([place]) 08/28/2018  . History of adenomatous polyp of colon 07/15/2017  . Liver lesion   . Paroxysmal atrial fibrillation ([place])    RFAblation 4/14 [facility]  . 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 Pulmonary embolism ([place])    post RFCA atrial fib  [facility] 4/14    Medications:  Medications Prior to Admission  Medication Sig Dispense Refill Last Dose  . albuterol (VENTOLIN HFA) 108 (90 Base) MCG/ACT inhaler Inhale 2 puffs into the lungs every morning.   Past Week at Unknown time  . apixaban (ELIQUIS) 5 MG TABS tablet Take 5 mg by mouth 2 (two) times daily.   10/04/2018 at 7:00am  . pantoprazole (PROTONIX) 40 MG tablet Take 40 mg by mouth daily.    10/04/2018 at Unknown time  . bisacodyl (DULCOLAX) 10 MG suppository Place 1 suppository (10 mg  total) rectally daily. (Patient not taking: Reported on 10/04/2018) 12 suppository 0 Not Taking at Unknown time  . ondansetron (ZOFRAN) 4 MG tablet Take 1 tablet (4 mg total) by mouth daily as needed for nausea or vomiting. (Patient not taking: Reported on 10/04/2018) 30 tablet 1 Not Taking at Unknown time  . polyethylene glycol (MIRALAX / GLYCOLAX) packet Take 17 g by mouth 2 (two) times daily. (Patient not taking: Reported on 10/04/2018) 60 each 0 Not Taking at Unknown time   Infusions:  . heparin 1,100 Units/hr (10/04/18 2348)    Assessment: Patient with history of DVT (left arm) and on apixaban 5mg  po bid with last dose noted 3/11 at 0700.    10/05/2018 APTT 32 seconds, subtherapeutic HL falsely elevated due to apixaban H/H slightly low, Plts WNL No bleeding per RN   Goal of Therapy:  Heparin level 0.3-0.7 units/ml aPTT 66-102 seconds Monitor platelets by anticoagulation protocol: Yes   Plan:  Increase Heparin drip to 1300 units/hr Check PTT in 8 hours CBC daily   Dolly Rias RPh 10/05/2018, 10:20 AM Pager [phone]

## 2018-10-05 NOTE — Progress Notes (Signed)
ANTICOAGULATION CONSULT NOTE - Follow Up Consult  Pharmacy Consult for Heparin (bridge apixaban) Indication: DVT  Allergies  Allergen Reactions  . Erythromycin Nausea And Vomiting  . Macrobid [Nitrofurantoin Macrocrystal] Nausea And Vomiting  . Polyethylene Glycol 3350     Other reaction(s): Other (See Comments) Total body spasms after high dose for colon prep. Well-tolerated in small doses subsequently.    Patient Measurements: Height: 5\' 7"  (170.2 cm) Weight: 158 lb 11.7 oz (72 kg) IBW/kg (Calculated) : 61.6 Heparin Dosing Weight:   Vital Signs: Temp: 98.4 F (36.9 C) (03/12 2149) Temp Source: Oral (03/12 2149) BP: 160/81 (03/12 2247) Pulse Rate: 73 (03/12 2247)  Labs: Recent Labs    10/04/18 1824 10/05/18 0415 10/05/18 0803 10/05/18 1856  HGB 12.1 10.8*  --   --   HCT 38.2 35.2*  --   --   PLT 244 226  --   --   APTT  --   --  32 106*  HEPARINUNFRC  --   --  1.10*  --   CREATININE 0.91 0.71  --   --     Estimated Creatinine Clearance: 62.7 mL/min (by C-G formula based on SCr of 0.71 mg/dL).   Medications:  Medications Prior to Admission  Medication Sig Dispense Refill Last Dose  . albuterol (VENTOLIN HFA) 108 (90 Base) MCG/ACT inhaler Inhale 2 puffs into the lungs every morning.   Past Week at Unknown time  . apixaban (ELIQUIS) 5 MG TABS tablet Take 5 mg by mouth 2 (two) times daily.   10/04/2018 at 7:00am  . pantoprazole (PROTONIX) 40 MG tablet Take 40 mg by mouth daily.    10/04/2018 at Unknown time  . bisacodyl (DULCOLAX) 10 MG suppository Place 1 suppository (10 mg total) rectally daily. (Patient not taking: Reported on 10/04/2018) 12 suppository 0 Not Taking at Unknown time  . ondansetron (ZOFRAN) 4 MG tablet Take 1 tablet (4 mg total) by mouth daily as needed for nausea or vomiting. (Patient not taking: Reported on 10/04/2018) 30 tablet 1 Not Taking at Unknown time  . polyethylene glycol (MIRALAX / GLYCOLAX) packet Take 17 g by mouth 2 (two) times daily.  (Patient not taking: Reported on 10/04/2018) 60 each 0 Not Taking at Unknown time   Infusions:  . heparin 1,300 Units/hr (10/05/18 2022)    Assessment: This note is for prior RPh who was unable to complete note due to Epic crashing.   PTT ordered with Heparin level until both correlate due to possible drug-lab interaction between oral anticoagulant (rivaroxaban, edoxaban, or apixaban) and anti-Xa level (aka heparin level)  PTT just above goal.   Goal of Therapy:  Heparin level 0.3-0.7 units/ml  PTT 66-102 sec Monitor platelets by anticoagulation protocol: Yes   Plan:  Decrease heparin to 1200 units Recheck PTT/HL at [address], [place] 10/05/2018,11:54 PM

## 2018-10-06 DIAGNOSIS — F419 Anxiety disorder, unspecified: Secondary | ICD-10-CM

## 2018-10-06 LAB — APTT
APTT: 98 s — AB (ref 24–36)
aPTT: 154 seconds — ABNORMAL HIGH (ref 24–36)

## 2018-10-06 LAB — HEPARIN LEVEL (UNFRACTIONATED): Heparin Unfractionated: 1.08 IU/mL — ABNORMAL HIGH (ref 0.30–0.70)

## 2018-10-06 LAB — CBC
HCT: 38.9 % (ref 36.0–46.0)
Hemoglobin: 11.7 g/dL — ABNORMAL LOW (ref 12.0–15.0)
MCH: 28.3 pg (ref 26.0–34.0)
MCHC: 30.1 g/dL (ref 30.0–36.0)
MCV: 94 fL (ref 80.0–100.0)
PLATELETS: 236 10*3/uL (ref 150–400)
RBC: 4.14 MIL/uL (ref 3.87–5.11)
RDW: 13.3 % (ref 11.5–15.5)
WBC: 5.2 10*3/uL (ref 4.0–10.5)
nRBC: 0 % (ref 0.0–0.2)

## 2018-10-06 MED ORDER — HEPARIN (PORCINE) 25000 UT/250ML-% IV SOLN
1000.0000 [IU]/h | INTRAVENOUS | Status: DC
  Administered 2018-10-06: 1000 [IU]/h via INTRAVENOUS
  Filled 2018-10-06 (×2): qty 250

## 2018-10-06 MED ORDER — SORBITOL 70 % SOLN
960.0000 mL | TOPICAL_OIL | Freq: Once | ORAL | Status: AC
  Administered 2018-10-06: 960 mL via RECTAL
  Filled 2018-10-06: qty 473

## 2018-10-06 MED ORDER — MAGNESIUM CITRATE PO SOLN
1.0000 | Freq: Once | ORAL | Status: AC
  Administered 2018-10-06: 1 via ORAL
  Filled 2018-10-06: qty 296

## 2018-10-06 MED ORDER — LORAZEPAM 2 MG/ML IJ SOLN
0.5000 mg | INTRAMUSCULAR | Status: DC | PRN
  Administered 2018-10-06: 0.5 mg via INTRAVENOUS
  Filled 2018-10-06: qty 1

## 2018-10-06 NOTE — Progress Notes (Signed)
ANTICOAGULATION CONSULT NOTE - Initial Consult  Pharmacy Consult for Heparin (bridge apixaban) Indication: DVT  Allergies  Allergen Reactions  . Erythromycin Nausea And Vomiting  . Macrobid [Nitrofurantoin Macrocrystal] Nausea And Vomiting  . Polyethylene Glycol 3350     Other reaction(s): Other (See Comments) Total body spasms after high dose for colon prep. Well-tolerated in small doses subsequently.    Patient Measurements: Height: 5\' 7"  (170.2 cm) Weight: 158 lb 11.7 oz (72 kg) IBW/kg (Calculated) : 61.6 Heparin Dosing Weight = TBW = 72 kg  Vital Signs: Temp: 97.7 F (36.5 C) (03/13 0532) Temp Source: Oral (03/13 0532) BP: 148/86 (03/13 0532) Pulse Rate: 79 (03/13 0532)  Labs: Recent Labs    10/04/18 1824 10/05/18 0415 10/05/18 0803 10/05/18 1856 10/06/18 0707  HGB 12.1 10.8*  --   --  11.7*  HCT 38.2 35.2*  --   --  38.9  PLT 244 226  --   --  236  APTT  --   --  32 106* 154*  HEPARINUNFRC  --   --  1.10*  --  1.08*  CREATININE 0.91 0.71  --   --   --     Estimated Creatinine Clearance: 62.7 mL/min (by C-G formula based on SCr of 0.71 mg/dL).   Medical History: Past Medical History:  Diagnosis Date  . Benign liver cysts 08/28/2018  . Cancer of sigmoid colon ([place]) 08/28/2018  . History of adenomatous polyp of colon 07/15/2017  . Liver lesion   . Paroxysmal atrial fibrillation ([place])    RFAblation 4/14 [facility]  . 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 Pulmonary embolism ([place])    post RFCA atrial fib  [facility] 4/14    Medications:  Medications Prior to Admission  Medication Sig Dispense Refill Last Dose  . albuterol (VENTOLIN HFA) 108 (90 Base) MCG/ACT inhaler Inhale 2 puffs into the lungs every morning.   Past Week at Unknown time  . apixaban (ELIQUIS) 5 MG TABS tablet Take 5 mg by mouth 2 (two) times daily.   10/04/2018 at 7:00am  . pantoprazole (PROTONIX) 40 MG tablet Take 40 mg by mouth  daily.    10/04/2018 at Unknown time  . bisacodyl (DULCOLAX) 10 MG suppository Place 1 suppository (10 mg total) rectally daily. (Patient not taking: Reported on 10/04/2018) 12 suppository 0 Not Taking at Unknown time  . ondansetron (ZOFRAN) 4 MG tablet Take 1 tablet (4 mg total) by mouth daily as needed for nausea or vomiting. (Patient not taking: Reported on 10/04/2018) 30 tablet 1 Not Taking at Unknown time  . polyethylene glycol (MIRALAX / GLYCOLAX) packet Take 17 g by mouth 2 (two) times daily. (Patient not taking: Reported on 10/04/2018) 60 each 0 Not Taking at Unknown time   Infusions:  . heparin      Assessment: Patient with history of DVT (left arm) and on apixaban 5mg  PO BID with last dose noted 3/11 at 0700.  Patient admitted with abdominal pain and  SBO - anticoagulation has been converted to heparin drip while NPO.  10/06/2018   Hgb 11.7 - slightly low but increased  Plt - stable, WNL  HL (1.08) remains elevated as a reflection of apixaban. APTT 154 is elevated as well  Heparin infusing through port. Confirmed that labs drawn left AC  No signs/symptoms of bleeding or issues with infusion per discussion with RN  Goal of Therapy:  Heparin level 0.3-0.7 units/ml aPTT 66-102  seconds Monitor platelets by anticoagulation protocol: Yes   Plan:   Hold heparin infusion for 1 hour  Decrease rate to 1000 units/hr  Check aPTT in 8 hours. Once aPTT and HL correlate, can switch to monitoring with HL only.   CBC and HL daily while on heparin  Lenis Noon, PharmD 10/06/18 10:47 AM

## 2018-10-06 NOTE — Progress Notes (Signed)
PROGRESS NOTE    Tonya Stanley  ZOX:[id number] DOB: Apr 10, 1946 DOA: 10/04/2018 PCP: Kathyrn Lass, MD    Brief Narrative:  73 y.o. female with medical history significant of Colon cancer, A.fib sp ablation in 2014, LEft Arm DVT on eliquis,   Stage IV ca carcinomatosis, SBO in the past   Presented with  Abdominal pain 2day similar to prior SBO, no nausea have not had a bowel movement in 2 days patient stopped eating so that she could have her symptoms improved but developed significant abdominal pain and presented to emergency department.  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 currently on anticoagulation  Assessment & Plan:   Active Problems:   H/o Atrial fibrillation (HCC)   SBO (small bowel obstruction) (HCC)   HTN (hypertension)   Restrictive lung disease   DVT (deep venous thrombosis) (HCC)   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 Moderate stool in colon -She is not interested in any surgical intervention would like to see if he can improve with conservative management after this she would like to only concentrate on comfort care and hospice at home - Pos BS. Tolerated clears. NG removed - Only small BM with soap suds enema earlier. See below   H/o Atrial  fibrillation ([place]) -   - CHA2DS2 vas score 3 : On Eliquis actually for history of DVT no longer on anticoagulation for atrial fibrillation currently on heparin sinus rhythm patient has been successfully converted in the past -remains hemodynamically stable    HTN (hypertension) not on any home medications for this stable continue to monitor. 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  Okay with IV fluids and NG tube while undergoing treatment for small bowel obstruction. 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  At this point patient strongly feels that after resolution of her small bowel obstruction she would like to transition to hospice She would like to avoid artificial feeding or IV fluids after she is discharged -Presently stable  Constipation -Stool noted throughout the colon per my own read of abd imaging -Failed trials of soap suds enema, dulcolax suppository, mg citrate -Will give trial of SMOG enema  DVT prophylaxis: Heparin gtt Code Status: DNR Family Communication: Pt in room, family not at bedside Disposition Plan: Uncertain at this time  Consultants:   Palliative Care  Procedures:     Antimicrobials: Anti-infectives (From admission, onward)   None      Subjective: Complained of  lower quadrant abd pain overnight  Objective: Vitals:   10/05/18 2149 10/05/18 2247 10/06/18 0532 10/06/18 1417  BP: (!) 172/84 (!) 160/81 (!) 148/86 135/82  Pulse: 76 73 79 72  Resp: 18 18 18 16   Temp: 98.4 F (36.9 C)  97.7 F (36.5 C) 97.9 F (36.6 C)  TempSrc: Oral  Oral Oral  SpO2:  95% 95% 96% 98%  Weight:      Height:        Intake/Output Summary (Last 24 hours) at 10/06/2018 1728 Last data filed at 10/06/2018 1418 Gross per 24 hour  Intake 1250.53 ml  Output 1850 ml  Net -599.47 ml   Filed Weights   10/04/18 1823 10/04/18 2300  Weight: 73 kg 72 kg    Examination: General exam: Awake, laying in bed, in nad Respiratory system: Normal respiratory effort, no wheezing Cardiovascular system: regular rate, s1, s2 Gastrointestinal system: mildly distended, pos BS Central nervous system: CN2-12 grossly intact, strength intact Extremities: Perfused, no clubbing Skin: Normal skin turgor, no notable skin lesions seen Psychiatry: Mood normal // no visual hallucinations   Data Reviewed: I have personally reviewed following labs and imaging studies  CBC: Recent Labs  Lab 10/04/18 1824 10/05/18 0415 10/06/18 0707  WBC 7.0 4.2 5.2  HGB 12.1 10.8* 11.7*  HCT 38.2 35.2* 38.9  MCV 91.8 94.6 94.0  PLT 244 226 761   Basic Metabolic Panel: Recent Labs  Lab 10/04/18 1824 10/05/18 0415  NA 137 138  K 3.7 3.8  CL 103 104  CO2 23 26  GLUCOSE 114* 83  BUN 13 11  CREATININE 0.91 0.71  CALCIUM 9.2 8.5*  MG  --  2.3  PHOS  --  4.2   GFR: Estimated Creatinine Clearance: 62.7 mL/min (by C-G formula based on SCr of 0.71 mg/dL). Liver Function Tests: Recent Labs  Lab 10/04/18 1824 10/05/18 0415  AST 16 14*  ALT 12 11  ALKPHOS 47 42  BILITOT 0.9 0.5  PROT 6.7 5.5*  ALBUMIN 4.0 3.2*   Recent Labs  Lab 10/04/18 1824  LIPASE 22   No results for input(s): AMMONIA in the last 168 hours. Coagulation Profile: No results for input(s): INR, PROTIME in the last 168 hours. Cardiac Enzymes: No results for input(s): CKTOTAL, CKMB, CKMBINDEX, TROPONINI in the last 168 hours. BNP (last 3 results) No results for input(s): PROBNP in the last 8760 hours. HbA1C: No results for input(s): HGBA1C in the last 72 hours. CBG: No results for input(s): GLUCAP in the last  168 hours. Lipid Profile: No results for input(s): CHOL, HDL, LDLCALC, TRIG, CHOLHDL, LDLDIRECT in the last 72 hours. Thyroid Function Tests: Recent Labs    10/05/18 0415  TSH 0.024*   Anemia Panel: No results for input(s): VITAMINB12, FOLATE, FERRITIN, TIBC, IRON, RETICCTPCT in the last 72 hours. Sepsis Labs: No results for input(s): PROCALCITON, LATICACIDVEN in the last 168 hours.  No results found for this or any previous visit (from the past 240 hour(s)).   Radiology Studies: Ct Abdomen Pelvis W Contrast  Result Date: 10/04/2018 CLINICAL DATA:  73 year old female with colon cancer, most recent chemotherapy in January. Recurrent bowel obstructions. Abdominal pain now and no bowel movement in 2 days. EXAM: CT ABDOMEN AND PELVIS WITH CONTRAST TECHNIQUE: Multidetector CT imaging of the abdomen and pelvis was performed using the standard protocol following bolus administration of intravenous contrast. CONTRAST:  <See Chart> ISOVUE-300 IOPAMIDOL (ISOVUE-300) INJECTION 61%, <See Chart> OMNIPAQUE IOHEXOL 300 MG/ML SOLN COMPARISON:  CT Abdomen and Pelvis  08/27/2018 and earlier. FINDINGS: Lower chest: Stable mild scarring in both the medial basal segment of the left lower lobe and the lateral basal segment of the right lower lobe. No lung base nodule, pericardial effusion or pleural effusion. Hepatobiliary: Stable liver with multiple benign hepatic cysts suspected. The gallbladder is mildly larger but otherwise appears negative. Pancreas: Stable pancreatic atrophy. Spleen: Negative. Adrenals/Urinary Tract: Normal adrenal glands. Bilateral renal enhancement and contrast excretion is symmetric and normal. Ureters remain normal. Unremarkable urinary bladder. Stomach/Bowel: Retained stool in the rectum. Similar indistinct appearance of the walls of the sigmoid colon on series 2, image 65. This is near the small volume of free fluid in the pelvis. No change since February identified. The more upstream  sigmoid and descending colon appear negative. Retained stool throughout the transverse and right colon. Persistent indistinct stranding throughout the mesentery from the mid transverse colon to the ascending colon (series 2, image 48). No associated mesenteric lymphadenopathy identified. The terminal ileum is decompressed but there are regional abnormal small bowel loops which appear matted together and thickened in the distal ileum (coronal images 60 through 48. Upstream of this area several loops of small bowel are mildly dilated up to 3 centimeters. Trace associated free fluid among those loops. The configuration of these abnormal loops does not appear significantly changed since February. But upstream of those loops the small bowel is nondilated today. The stomach is mildly distended with fluid. No pneumoperitoneum identified. Vascular/Lymphatic: Aortoiliac calcified atherosclerosis. Major arterial structures are patent. Portal venous system is patent. Reproductive: Negative. Other: A small volume of pelvic fluid which appears to be in the distal large or small bowel mesentery on series 2, image 68 is not significantly changed from February. Musculoskeletal: Lower lumbar disc and endplate degeneration. No acute osseous abnormality identified. IMPRESSION: 1. Recurrent small bowel obstruction in the right lower quadrant where small bowel appears matted and tethered just upstream of the TI. The appearance somewhat resembles a closed loop obstruction given the lack of more generalized small bowel dilatation today, but I feel that is unlikely given the loop configuration in the RLQ is stable since February. Small volume of free fluid in the mesentery.  No free air. 2. Otherwise stable CT appearance of the abdomen and pelvis since February. Electronically Signed   By: Genevie Ann M.D.   On: 10/04/2018 20:32   Dg Abd Portable 1 View  Result Date: 10/04/2018 CLINICAL DATA:  Encounter for NG tube placement EXAM: PORTABLE  ABDOMEN - 1 VIEW COMPARISON:  None. FINDINGS: The tip and side port of a gastric tube is noted in the expected location of the gastric body. Contrast is seen both renal collecting systems without obstruction. Moderate stool retention is seen within the. No significant small bowel dilatation is identified. IMPRESSION: Gastric tube is identified in the expected location of the stomach. No significant bowel distention. Moderate stool retention is noted within the colon. Electronically Signed   By: Ashley Royalty M.D.   On: 10/04/2018 21:36    Scheduled Meds:  sorbitol, milk of mag, mineral oil, glycerin (SMOG) enema  960 mL Rectal Once   Continuous Infusions:  heparin 1,000 Units/hr (10/06/18 1128)     LOS: 2 days   Marylu Lund, MD [facility] Pager On Amion  If 7PM-7AM, please contact night-coverage 10/06/2018, 5:28 PM

## 2018-10-06 NOTE — Progress Notes (Signed)
ANTICOAGULATION CONSULT NOTE - Consult  Pharmacy Consult for Heparin (bridge apixaban) Indication: DVT  Allergies  Allergen Reactions  . Erythromycin Nausea And Vomiting  . Macrobid [Nitrofurantoin Macrocrystal] Nausea And Vomiting  . Polyethylene Glycol 3350     Other reaction(s): Other (See Comments) Total body spasms after high dose for colon prep. Well-tolerated in small doses subsequently.    Patient Measurements: Height: 5\' 7"  (170.2 cm) Weight: 158 lb 11.7 oz (72 kg) IBW/kg (Calculated) : 61.6 Heparin Dosing Weight = TBW = 72 kg  Vital Signs: Temp: 97.9 F (36.6 C) (03/13 1417) Temp Source: Oral (03/13 1417) BP: 135/82 (03/13 1417) Pulse Rate: 72 (03/13 1417)  Labs: Recent Labs    10/04/18 1824 10/05/18 0415  10/05/18 0803 10/05/18 1856 10/06/18 0707 10/06/18 1820  HGB 12.1 10.8*  --   --   --  11.7*  --   HCT 38.2 35.2*  --   --   --  38.9  --   PLT 244 226  --   --   --  236  --   APTT  --   --    < > 32 106* 154* 98*  HEPARINUNFRC  --   --   --  1.10*  --  1.08*  --   CREATININE 0.91 0.71  --   --   --   --   --    < > = values in this interval not displayed.    Estimated Creatinine Clearance: 62.7 mL/min (by C-G formula based on SCr of 0.71 mg/dL).   Medical History: Past Medical History:  Diagnosis Date  . Benign liver cysts 08/28/2018  . Cancer of sigmoid colon ([place]) 08/28/2018  . History of adenomatous polyp of colon 07/15/2017  . Liver lesion   . Paroxysmal atrial fibrillation ([place])    RFAblation 4/14 [facility]  . 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 Pulmonary embolism ([place])    post RFCA atrial fib  [facility] 4/14    Medications:  Medications Prior to Admission  Medication Sig Dispense Refill Last Dose  . albuterol (VENTOLIN HFA) 108 (90 Base) MCG/ACT inhaler Inhale 2 puffs into the lungs every morning.   Past Week at Unknown time  . apixaban (ELIQUIS) 5 MG TABS tablet Take 5 mg  by mouth 2 (two) times daily.   10/04/2018 at 7:00am  . pantoprazole (PROTONIX) 40 MG tablet Take 40 mg by mouth daily.    10/04/2018 at Unknown time  . bisacodyl (DULCOLAX) 10 MG suppository Place 1 suppository (10 mg total) rectally daily. (Patient not taking: Reported on 10/04/2018) 12 suppository 0 Not Taking at Unknown time  . ondansetron (ZOFRAN) 4 MG tablet Take 1 tablet (4 mg total) by mouth daily as needed for nausea or vomiting. (Patient not taking: Reported on 10/04/2018) 30 tablet 1 Not Taking at Unknown time  . polyethylene glycol (MIRALAX / GLYCOLAX) packet Take 17 g by mouth 2 (two) times daily. (Patient not taking: Reported on 10/04/2018) 60 each 0 Not Taking at Unknown time   Infusions:  . heparin 1,000 Units/hr (10/06/18 1128)    Assessment: Patient with history of DVT (left arm) and on apixaban 5mg  PO BID with last dose noted 3/11 at 0700.  Patient admitted with abdominal pain and  SBO - anticoagulation has been converted to heparin drip while NPO.  10/06/2018 PM  APTT therapeutic (98 seconds) after holding heparin drip for 1hr and decreasing rate  to 1000 units/hr  Heparin infusing through port. Confirmed that labs drawn left AC  No signs/symptoms of bleeding or issues with infusion reported  Goal of Therapy:  Heparin level 0.3-0.7 units/ml aPTT 66-102 seconds Monitor platelets by anticoagulation protocol: Yes   Plan:   Continue heparin drip at 1000 units/hr  Once aPTT and HL correlate, can switch to monitoring with HL only.   CBC and HL daily while on heparin  Peggyann Juba, PharmD, BCPS Pager: [phone] 10/06/18 7:04 PM

## 2018-10-06 NOTE — Progress Notes (Signed)
MD on call notified of patient's BP of 172/84. Patient C/O nausea, NG unclamped and about 50 ml of output in canister. NG clamped after getting no additional output, BP monitored and rechecked BP is currently 160/81. MD is aware. Lavonna Monarch, RN

## 2018-10-06 NOTE — Care Management Important Message (Signed)
Important Message  Patient Details  Name: MYSTIE ORMAND MRN: [id number] Date of Birth: 01-30-1946   Medicare Important Message Given:  Yes    Kerin Salen 10/06/2018, 11:36 AMImportant Message  Patient Details  Name: MAHOGANI HOLOHAN MRN: [id number] Date of Birth: [birth year]   Medicare Important Message Given:  Yes    Kerin Salen 10/06/2018, 11:36 AM

## 2018-10-07 DIAGNOSIS — J984 Other disorders of lung: Secondary | ICD-10-CM

## 2018-10-07 LAB — CBC
HCT: 37.7 % (ref 36.0–46.0)
Hemoglobin: 11.6 g/dL — ABNORMAL LOW (ref 12.0–15.0)
MCH: 28.3 pg (ref 26.0–34.0)
MCHC: 30.8 g/dL (ref 30.0–36.0)
MCV: 92 fL (ref 80.0–100.0)
PLATELETS: 229 10*3/uL (ref 150–400)
RBC: 4.1 MIL/uL (ref 3.87–5.11)
RDW: 13.3 % (ref 11.5–15.5)
WBC: 5 10*3/uL (ref 4.0–10.5)
nRBC: 0 % (ref 0.0–0.2)

## 2018-10-07 LAB — APTT: aPTT: 111 seconds — ABNORMAL HIGH (ref 24–36)

## 2018-10-07 LAB — HEPARIN LEVEL (UNFRACTIONATED): HEPARIN UNFRACTIONATED: 1.1 [IU]/mL — AB (ref 0.30–0.70)

## 2018-10-07 MED ORDER — HEPARIN (PORCINE) 25000 UT/250ML-% IV SOLN
900.0000 [IU]/h | INTRAVENOUS | Status: DC
  Administered 2018-10-07: 900 [IU]/h via INTRAVENOUS
  Filled 2018-10-07: qty 250

## 2018-10-07 MED ORDER — METOCLOPRAMIDE HCL 5 MG/ML IJ SOLN
10.0000 mg | Freq: Four times a day (QID) | INTRAMUSCULAR | Status: DC
  Administered 2018-10-07: 10 mg via INTRAVENOUS
  Filled 2018-10-07: qty 2

## 2018-10-07 MED ORDER — HEPARIN SOD (PORK) LOCK FLUSH 100 UNIT/ML IV SOLN
500.0000 [IU] | INTRAVENOUS | Status: AC | PRN
  Administered 2018-10-07: 500 [IU]

## 2018-10-07 MED ORDER — LACTULOSE 10 GM/15ML PO SOLN: 10.0000 g | Freq: Two times a day (BID) | ORAL | 0 refills | Status: AC | PRN

## 2018-10-07 NOTE — Plan of Care (Signed)
Reviewed discharge instructions; copy given. IV removed. Patient ready for discharge.  

## 2018-10-07 NOTE — Progress Notes (Signed)
Daily Progress Note   Tonya Stanley Name: Tonya Stanley       Date: 10/07/2018 DOB: [birth year]  Age: 73 y.o. MRN#: [id number] Attending Physician: Donne Hazel, MD Primary Care Physician: Kathyrn Lass, MD Admit Date: 10/04/2018  Reason for Consultation/Follow-up: Establishing goals of care  Subjective: I saw and examined Tonya Stanley today.  He reports having "not very good" day.  Reports having lots of abdominal discomfort and feeling of fullness.  Tonya Stanley becomes upset multiple times throughout encounter stating that "I do not want to continue this way.  I wish I could just go to [facility] now."  Length of Stay: 3  Current Medications: Scheduled Meds:    Continuous Infusions: . heparin 900 Units/hr (10/07/18 0811)    PRN Meds: acetaminophen **OR** acetaminophen, albuterol, iohexol, LORazepam, morphine injection, ondansetron **OR** ondansetron (ZOFRAN) IV, sodium chloride flush  Physical Exam  General: Alert, awake, in no acute distress.  HEENT: No bruits, no goiter, no JVD Heart: Regular rate and rhythm. No murmur appreciated. Lungs: Good air movement, clear Abdomen: Mild distended, globally mild tender, positive bowel sounds.  Ext: No significant edema Skin: Warm and dry Neuro: Grossly intact, nonfocal.        Vital Signs: BP 120/78 (BP Location: Right Arm)   Pulse 77   Temp 98 F (36.7 C) (Oral)   Resp 16   Ht 5\' 7"  (1.702 m)   Wt 72 kg   SpO2 94%   BMI 24.86 kg/m  SpO2: SpO2: 94 % O2 Device: O2 Device: Room Air O2 Flow Rate:    Intake/output summary:   Intake/Output Summary (Last 24 hours) at 10/07/2018 0859 Last data filed at 10/07/2018 4742 Gross per 24 hour  Intake 675.57 ml  Output 900 ml  Net -224.43 ml   LBM: Last BM Date: 10/06/18 Baseline Weight:  Weight: 73 kg Most recent weight: Weight: 72 kg       Palliative Assessment/Data:    Flowsheet Rows     Most Recent Value  Intake Tab  Referral Department  Hospitalist  Unit at Time of Referral  ER  Palliative Care Primary Diagnosis  Cancer  Date Notified  10/05/18  Palliative Care Type  New Palliative care  Reason for referral  Clarify Goals of Care  Date of Admission  10/04/18  Date first seen  by Palliative Care  10/05/18  # of days Palliative referral response time  0 Day(s)  # of days IP prior to Palliative referral  1  Clinical Assessment  Palliative Performance Scale Score  50%  Psychosocial & Spiritual Assessment  Palliative Care Outcomes  Tonya Stanley/Family meeting held?  Yes  Who was at the meeting?  Tonya Stanley  Palliative Care Outcomes  Clarified goals of care, Improved non-pain symptom therapy      Tonya Stanley Active Problem List   Diagnosis Date Noted  . DVT (deep venous thrombosis) ([place]) 08/28/2018  . Benign liver cysts 08/28/2018  . Current use of long term anticoagulation 08/28/2018  . Cancer of sigmoid colon ([place]) 08/28/2018  . 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 Maintenance antineoplastic chemotherapy July 2019 - (FOLFOX, Avastin) 08/28/2018  . History of pulmonary embolus (PE) 2014   . Hoarseness 07/11/2018  . Goiter 07/11/2018  . Restrictive lung disease 07/11/2018  . 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 Constipation 01/03/2018  . HTN (hypertension) 10/02/2017  . Enteritis 10/02/2017  . SBO (small bowel obstruction) ([place]) 10/01/2017  . Nontoxic multinodular goiter 09/14/2016  . Vitamin D deficiency 09/14/2016  . H/o Atrial fibrillation ([place]) 06/28/2012    Palliative Care Assessment & Plan  Recommendations/Plan:  Overall goal remains transition home with the support of hospice when Tonya Stanley is medically ready.  Tonya Stanley states that Tonya Stanley wishes Tonya Stanley could go directly to [facility], and I discussed that we would continue to evaluate daily if we feel we are reaching a point where this would be appropriate based upon Tonya Stanley clinical course.  Agree with plan for smog enema for constipation.  Tonya Stanley has been avoiding pain medication as Tonya Stanley is afraid it will contribute to constipation.  While it is certainly the case that pain medication causes constipation, I do not think that is the predominant problem in this situation and I told Tonya Stanley that Tonya Stanley could certainly use pain medication as needed.  Tonya Stanley remains very anxious and emotionally overwhelmed by Tonya Stanley situation.  We will plan for addition of Ativan as needed as I believe this will be beneficial for acute anxiety.  Goals of Care and Additional Recommendations:  Limitations on Scope of Treatment: Avoid Hospitalization and Full Comfort Care  Code Status:    Code Status Orders  (From admission, onward)         Start     Ordered   10/04/18 2300  Do not attempt resuscitation (DNR)  Continuous    Question Answer Comment  In the event of cardiac or respiratory ARREST Do not call a "code blue"   In the event of cardiac or respiratory ARREST Do not perform Intubation, CPR, defibrillation or ACLS   In the event of cardiac or respiratory ARREST Use medication by any route, position, wound care, and other measures to relive pain and suffering. May use oxygen, suction and manual treatment of airway obstruction as needed for comfort.      10/04/18 2259        Code Status History    Date Active Date Inactive Code Status Order ID Comments User Context   08/28/2018 0256 08/30/2018 1336 Full Code [id number]  Rise Patience, MD Inpatient   10/01/2017 0414 10/02/2017 1753 Full Code [id number]  Rise Patience, MD Inpatient       Prognosis:   < 6 months  Discharge Planning:  Home with Hospice  Care plan was discussed with  Tonya Stanley, Dr. Sherrian Divers, RN  Thank you for allowing the Palliative Medicine Team to assist in the care of  this Tonya Stanley.   Total Time 40 Prolonged Time Billed no      Greater than 50%  of this time was spent counseling and coordinating care related to the above assessment and plan.  Micheline Rough, MD  Please contact Palliative Medicine Team phone at [phone] for questions and concerns.

## 2018-10-07 NOTE — Progress Notes (Signed)
ANTICOAGULATION CONSULT NOTE - Consult  Pharmacy Consult for Heparin (bridge apixaban) Indication: DVT  Allergies  Allergen Reactions  . Erythromycin Nausea And Vomiting  . Macrobid [Nitrofurantoin Macrocrystal] Nausea And Vomiting  . Polyethylene Glycol 3350     Other reaction(s): Other (See Comments) Total body spasms after high dose for colon prep. Well-tolerated in small doses subsequently.    Patient Measurements: Height: 5\' 7"  (170.2 cm) Weight: 158 lb 11.7 oz (72 kg) IBW/kg (Calculated) : 61.6 Heparin Dosing Weight = TBW = 72 kg  Vital Signs: Temp: 98 F (36.7 C) (03/14 0608) Temp Source: Oral (03/14 0608) BP: 120/78 (03/14 5053) Pulse Rate: 77 (03/14 0608)  Labs: Recent Labs    10/04/18 1824 10/05/18 0415 10/05/18 0803  10/06/18 0707 10/06/18 1820 10/07/18 0427  HGB 12.1 10.8*  --   --  11.7*  --  11.6*  HCT 38.2 35.2*  --   --  38.9  --  37.7  PLT 244 226  --   --  236  --  229  APTT  --   --  32   < > 154* 98* 111*  HEPARINUNFRC  --   --  1.10*  --  1.08*  --  1.10*  CREATININE 0.91 0.71  --   --   --   --   --    < > = values in this interval not displayed.    Estimated Creatinine Clearance: 62.7 mL/min (by C-G formula based on SCr of 0.71 mg/dL).   Medical History: Past Medical History:  Diagnosis Date  . Benign liver cysts 08/28/2018  . Cancer of sigmoid colon ([place]) 08/28/2018  . History of adenomatous polyp of colon 07/15/2017  . Liver lesion   . Paroxysmal atrial fibrillation ([place])    RFAblation 4/14 [facility]  . 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 Pulmonary embolism ([place])    post RFCA atrial fib  [facility] 4/14    Medications:  Medications Prior to Admission  Medication Sig Dispense Refill Last Dose  . albuterol (VENTOLIN HFA) 108 (90 Base) MCG/ACT inhaler Inhale 2 puffs into the lungs every morning.   Past Week at Unknown time  . apixaban (ELIQUIS) 5 MG TABS tablet Take 5 mg  by mouth 2 (two) times daily.   10/04/2018 at 7:00am  . pantoprazole (PROTONIX) 40 MG tablet Take 40 mg by mouth daily.    10/04/2018 at Unknown time  . bisacodyl (DULCOLAX) 10 MG suppository Place 1 suppository (10 mg total) rectally daily. (Patient not taking: Reported on 10/04/2018) 12 suppository 0 Not Taking at Unknown time  . ondansetron (ZOFRAN) 4 MG tablet Take 1 tablet (4 mg total) by mouth daily as needed for nausea or vomiting. (Patient not taking: Reported on 10/04/2018) 30 tablet 1 Not Taking at Unknown time  . polyethylene glycol (MIRALAX / GLYCOLAX) packet Take 17 g by mouth 2 (two) times daily. (Patient not taking: Reported on 10/04/2018) 60 each 0 Not Taking at Unknown time   Infusions:  . heparin      Assessment: Patient with history of DVT (left arm) and on apixaban 5mg  PO BID with last dose noted 3/11 at 0700.  Patient admitted with abdominal pain and  SBO - anticoagulation has been converted to heparin drip while NPO.  10/07/2018   APTT slightly supratherapeutic (111 seconds) on heparin infusion of 1000 units/hr  HL remains elevated as a reflection of apixaban PTA  No  signs/symptoms of bleeding or issues with infusion reported per RN  Hgb 11.6, Plt 229 - stable  Goal of Therapy:  Heparin level 0.3-0.7 units/ml aPTT 66-102 seconds Monitor platelets by anticoagulation protocol: Yes   Plan:   Decrease heparin drip to 900 units/hr  Check aPTT in 8 hours.  Recheck HL tomorrow morning. Once aPTT and HL correlate, can switch to monitoring with HL only.   CBC and HL daily while on heparin.  Monitor for signs/symptoms of bleeding or thrombosis  Lenis Noon, PharmD 10/07/18 8:12 AM

## 2018-10-07 NOTE — Progress Notes (Signed)
Daily Progress Note   Patient Name: Tonya Stanley       Date: 10/07/2018 DOB: Sep 20, 1945  Age: 73 y.o. MRN#: [id number] Attending Physician: No att. providers found Primary Care Physician: Kathyrn Lass, MD Admit Date: 10/04/2018  Reason for Consultation/Follow-up: Establishing goals of care  Subjective: I saw and examined Tonya Stanley today.  Reports feeling better today but still without BM.  Discussed plan for bowel regimen and she requested to be allowed to go home and have hospice help manage- "If the plan is basically to get more enemas, isn't that something that I could do in my own home?"  Length of Stay: 3  Physical Exam  General: Alert, awake, in no acute distress.  HEENT: No bruits, no goiter, no JVD Heart: Regular rate and rhythm. No murmur appreciated. Lungs: Good air movement, clear Abdomen: Mild distended, globally mild tender, positive bowel sounds.  Ext: No significant edema Skin: Warm and dry Neuro: Grossly intact, nonfocal.        Vital Signs: BP 120/78 (BP Location: Right Arm)   Pulse 77   Temp 98 F (36.7 C) (Oral)   Resp 16   Ht 5\' 7"  (1.702 m)   Wt 72 kg   SpO2 94%   BMI 24.86 kg/m  SpO2: SpO2: 94 % O2 Device: O2 Device: Room Air O2 Flow Rate:    Intake/output summary:   Intake/Output Summary (Last 24 hours) at 10/07/2018 1746 Last data filed at 10/07/2018 1000 Gross per 24 hour  Intake 133.26 ml  Output 0 ml  Net 133.26 ml   LBM: Last BM Date: 10/06/18 Baseline Weight: Weight: 73 kg Most recent weight: Weight: 72 kg   Flowsheet Rows     Most Recent Value  Intake Tab  Referral Department  Hospitalist  Unit at Time of Referral  ER  Palliative Care Primary Diagnosis  Cancer  Date Notified  10/05/18  Palliative Care Type  New Palliative care   Reason for referral  Clarify Goals of Care  Date of Admission  10/04/18  Date first seen by Palliative Care  10/05/18  # of days Palliative referral response time  0 Day(s)  # of days IP prior to Palliative referral  1  Clinical Assessment  Palliative Performance Scale Score  50%  Psychosocial & Spiritual Assessment  Palliative Care  Outcomes  Patient/Family meeting held?  Yes  Who was at the meeting?  Patient  Palliative Care Outcomes  Clarified goals of care, Improved non-pain symptom therapy      Patient Active Problem List   Diagnosis Date Noted  . DVT (deep venous thrombosis) ([place]) 08/28/2018  . Benign liver cysts 08/28/2018  . Current use of long term anticoagulation 08/28/2018  . Cancer of sigmoid colon ([place]) 08/28/2018  . 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 Maintenance antineoplastic chemotherapy July 2019 - (FOLFOX, Avastin) 08/28/2018  . History of pulmonary embolus (PE) 2014   . Hoarseness 07/11/2018  . Goiter 07/11/2018  . Restrictive lung disease 07/11/2018  . 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 Constipation 01/03/2018  . HTN (hypertension) 10/02/2017  . Enteritis 10/02/2017  . SBO (small bowel obstruction) ([place]) 10/01/2017  . Nontoxic multinodular goiter 09/14/2016  . Vitamin D deficiency 09/14/2016  . H/o Atrial fibrillation ([place]) 06/28/2012    Palliative Care Assessment & Plan  Recommendations/Plan: - Plan to transition home with the support of hospice when she is medically ready.  She still is not passing much in way of stool, but is requesting discharge home with hospice today.  Goals of Care and Additional Recommendations:  Limitations on Scope of Treatment: Avoid Hospitalization and Full Comfort Care  Code Status:    Code Status Orders  (From admission, onward)         Start     Ordered   10/04/18 2300  Do not attempt resuscitation (DNR)  Continuous     Question Answer Comment  In the event of cardiac or respiratory ARREST Do not call a "code blue"   In the event of cardiac or respiratory ARREST Do not perform Intubation, CPR, defibrillation or ACLS   In the event of cardiac or respiratory ARREST Use medication by any route, position, wound care, and other measures to relive pain and suffering. May use oxygen, suction and manual treatment of airway obstruction as needed for comfort.      10/04/18 2259        Code Status History    Date Active Date Inactive Code Status Order ID Comments User Context   08/28/2018 0256 08/30/2018 1336 Full Code [id number]  Rise Patience, MD Inpatient   10/01/2017 0414 10/02/2017 1753 Full Code [id number]  Rise Patience, MD Inpatient       Prognosis:   < 6 months  Discharge Planning:  Home with Hospice  Care plan was discussed with patient, Dr. Sherrian Divers, RN  Thank you for allowing the Palliative Medicine Team to assist in the care of this patient.   Total Time 20 Prolonged Time Billed no      Greater than 50%  of this time was spent counseling and coordinating care related to the above assessment and plan.  Micheline Rough, MD  Please contact Palliative Medicine Team phone at [phone] for questions and concerns.

## 2018-10-07 NOTE — Discharge Summary (Signed)
Physician Discharge Summary  Tonya Stanley XLK:[id number] DOB: 17-Jul-1946 DOA: 10/04/2018  PCP: Kathyrn Lass, MD  Admit date: 10/04/2018 Discharge date: 10/07/2018  Admitted From: Home Disposition:  Home with hospice  Recommendations for Outpatient Follow-up:  1. Follow up with PCP as needed 2. Follow up with hospice services  Discharge Condition:Stable CODE STATUS:DNR Diet recommendation: Comfort as tolerated   Brief/Interim Summary: [name], A.fib sp ablation in 2014, LEft Arm DVT on eliquis, Stage IV ca carcinomatosis,SBO in the past  Presented withAbdominal pain 2day similar to prior SBO, no nauseahave not had a bowel movement in 2 days patient stopped eating so that she could have her symptoms improved but developed significant abdominal pain and presented to emergency department.  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 SBO (small bowel obstruction) ([place]) -Likely related to suspectedadhesions, malignancy  - Continued on NG tube - abd xray personally reviewed. Moderate stool in  colon -She is not interested in any surgical intervention would like to see if he can improve with conservative management after this she would like to only concentrate on comfort care and hospice at home - Pos BS. Tolerated clears. NG removed - Small BM noted after multiple cathartics and enemas. Patient otherwise feels well at this time and is asking to go home with hospice care. Discussed with Palliative Care  . H/o Atrial fibrillation ([place])- - CHA2DS2 vas score 3: On Eliquis actually for history of DVT no longer on anticoagulation for atrial fibrillation currently on heparin sinus rhythm patient has been successfully converted in the past -remains hemodynamically stable  . HTN (hypertension)not on any home medications for this stable continue to monitor. -Presently stable  . DVT (deep venous thrombosis) ([place])- -Presently remains on heparin, anticipate resuming oral anticoagulation when able to reliably tolerate PO  . Peritoneal carcinomatosis from colon cancerpatient at this point would like to concentrate on comfort measures but first she would like to have a small bowel obstruction resolved with conservative measures only. Okay with IV fluids and NG tube while undergoing treatment for small bowel obstruction. -Hospice following  . Adenocarcinoma from colon causing carcinomatosis & SBOrecurrent events. At this point patient strongly feels that after resolution of her small bowel obstruction she would like to transition to hospice She would like to avoid artificial feeding or IV fluids after she is discharged -Presently stable  Constipation -Stool noted throughout the colon per my own read of abd imaging -Failed trials of soap suds enema, dulcolax suppository, mg citrate -Some result with enemas   Discharge Instructions   Allergies as of 10/07/2018      Reactions   Erythromycin Nausea  And Vomiting   Macrobid [nitrofurantoin Macrocrystal] Nausea And Vomiting    Polyethylene Glycol 3350    Other reaction(s): Other (See Comments) Total body spasms after high dose for colon prep. Well-tolerated in small doses subsequently.      Medication List    TAKE these medications   bisacodyl 10 MG suppository Commonly known as:  DULCOLAX Place 1 suppository (10 mg total) rectally daily.   Eliquis 5 MG Tabs tablet Generic drug:  apixaban Take 5 mg by mouth 2 (two) times daily.   lactulose 10 GM/15ML solution Commonly known as:  CHRONULAC Take 15 mLs (10 g total) by mouth 2 (two) times daily as needed for moderate constipation.   ondansetron 4 MG tablet Commonly known as:  Zofran Take 1 tablet (4 mg total) by mouth daily as needed for nausea or vomiting.   pantoprazole 40 MG tablet Commonly known as:  PROTONIX Take 40 mg by mouth daily.   polyethylene glycol packet Commonly known as:  MIRALAX / GLYCOLAX Take 17 g by mouth 2 (two) times daily.   Ventolin HFA 108 (90 Base) MCG/ACT inhaler Generic drug:  albuterol Inhale 2 puffs into the lungs every morning.      Follow-up Information    [facility] Follow up.   Why:  Home nurse Contact information: [place] [phone]       Kathyrn Lass, MD Follow up.   Specialty:  Family Medicine Why:  as needed Contact information: [place] [address] [phone]          Allergies  Allergen Reactions  . Erythromycin Nausea And Vomiting  . Macrobid [Nitrofurantoin Macrocrystal] Nausea And Vomiting  . Polyethylene Glycol 3350     Other reaction(s): Other (See Comments) Total body spasms after high dose for colon prep. Well-tolerated in small doses subsequently.    Consultations:  Palliative Care  Procedures/Studies: Ct Abdomen Pelvis W Contrast  Result Date: 10/04/2018 CLINICAL DATA:  73 year old female with colon cancer, most recent chemotherapy in January. Recurrent bowel obstructions. Abdominal pain now and  no bowel movement in 2 days. EXAM: CT ABDOMEN AND PELVIS WITH CONTRAST TECHNIQUE: Multidetector CT imaging of the abdomen and pelvis was performed using the standard protocol following bolus administration of intravenous contrast. CONTRAST:  <See Chart> ISOVUE-300 IOPAMIDOL (ISOVUE-300) INJECTION 61%, <See Chart> OMNIPAQUE IOHEXOL 300 MG/ML SOLN COMPARISON:  CT Abdomen and Pelvis 08/27/2018 and earlier. FINDINGS: Lower chest: Stable mild scarring in both the medial basal segment of the left lower lobe and the lateral basal segment of the right lower lobe. No lung base nodule, pericardial effusion or pleural effusion. Hepatobiliary: Stable liver with multiple benign hepatic cysts suspected. The gallbladder is mildly larger but otherwise appears negative. Pancreas: Stable pancreatic atrophy. Spleen: Negative. Adrenals/Urinary Tract: Normal adrenal glands. Bilateral renal enhancement and contrast excretion is symmetric and normal. Ureters remain normal. Unremarkable urinary bladder. Stomach/Bowel: Retained stool in the rectum. Similar indistinct appearance of the walls of the sigmoid colon on series 2, image 65. This is near the small volume of free fluid in the pelvis. No change since February identified. The more upstream sigmoid and descending colon appear negative. Retained stool throughout the transverse and right colon. Persistent indistinct stranding throughout the mesentery from the mid transverse colon to the ascending colon (series 2, image 48). No associated mesenteric lymphadenopathy identified. The terminal ileum is decompressed but there are regional abnormal small bowel loops which appear matted together and thickened in the distal ileum (  coronal images 60 through 48. Upstream of this area several loops of small bowel are mildly dilated up to 3 centimeters. Trace associated free fluid among those loops. The configuration of these abnormal loops does not appear significantly changed since February. But  upstream of those loops the small bowel is nondilated today. The stomach is mildly distended with fluid. No pneumoperitoneum identified. Vascular/Lymphatic: Aortoiliac calcified atherosclerosis. Major arterial structures are patent. Portal venous system is patent. Reproductive: Negative. Other: A small volume of pelvic fluid which appears to be in the distal large or small bowel mesentery on series 2, image 68 is not significantly changed from February. Musculoskeletal: Lower lumbar disc and endplate degeneration. No acute osseous abnormality identified. IMPRESSION: 1. Recurrent small bowel obstruction in the right lower quadrant where small bowel appears matted and tethered just upstream of the TI. The appearance somewhat resembles a closed loop obstruction given the lack of more generalized small bowel dilatation today, but I feel that is unlikely given the loop configuration in the RLQ is stable since February. Small volume of free fluid in the mesentery.  No free air. 2. Otherwise stable CT appearance of the abdomen and pelvis since February. Electronically Signed   By: Genevie Ann M.D.   On: 10/04/2018 20:32   Dg Abd Portable 1 View  Result Date: 10/04/2018 CLINICAL DATA:  Encounter for NG tube placement EXAM: PORTABLE ABDOMEN - 1 VIEW COMPARISON:  None. FINDINGS: The tip and side port of a gastric tube is noted in the expected location of the gastric body. Contrast is seen both renal collecting systems without obstruction. Moderate stool retention is seen within the. No significant small bowel dilatation is identified. IMPRESSION: Gastric tube is identified in the expected location of the stomach. No significant bowel distention. Moderate stool retention is noted within the colon. Electronically Signed   By: Ashley Royalty M.D.   On: 10/04/2018 21:36     Subjective: Asking to go home today. Tolerated breakfast  Discharge Exam: Vitals:   10/06/18 2219 10/07/18 0608  BP: 121/73 120/78  Pulse: 70 77   Resp: 16 16  Temp: 98.3 F (36.8 C) 98 F (36.7 C)  SpO2: 94% 94%   Vitals:   10/06/18 0532 10/06/18 1417 10/06/18 2219 10/07/18 0608  BP: (!) 148/86 135/82 121/73 120/78  Pulse: 79 72 70 77  Resp: 18 16 16 16   Temp: 97.7 F (36.5 C) 97.9 F (36.6 C) 98.3 F (36.8 C) 98 F (36.7 C)  TempSrc: Oral Oral Oral Oral  SpO2: 96% 98% 94% 94%  Weight:      Height:        General: Pt is alert, awake, not in acute distress Cardiovascular: RRR, S1/S2 +, no rubs, no gallops Respiratory: CTA bilaterally, no wheezing, no rhonchi Abdominal: Soft, NT, ND, bowel sounds + Extremities: no edema, no cyanosis   The results of significant diagnostics from this hospitalization (including imaging, microbiology, ancillary and laboratory) are listed below for reference.     Microbiology: No results found for this or any previous visit (from the past 240 hour(s)).   Labs: BNP (last 3 results) No results for input(s): BNP in the last 8760 hours. Basic Metabolic Panel: Recent Labs  Lab 10/04/18 1824 10/05/18 0415  NA 137 138  K 3.7 3.8  CL 103 104  CO2 23 26  GLUCOSE 114* 83  BUN 13 11  CREATININE 0.91 0.71  CALCIUM 9.2 8.5*  MG  --  2.3  PHOS  --  4.2  Liver Function Tests: Recent Labs  Lab 10/04/18 1824 10/05/18 0415  AST 16 14*  ALT 12 11  ALKPHOS 47 42  BILITOT 0.9 0.5  PROT 6.7 5.5*  ALBUMIN 4.0 3.2*   Recent Labs  Lab 10/04/18 1824  LIPASE 22   No results for input(s): AMMONIA in the last 168 hours. CBC: Recent Labs  Lab 10/04/18 1824 10/05/18 0415 10/06/18 0707 10/07/18 0427  WBC 7.0 4.2 5.2 5.0  HGB 12.1 10.8* 11.7* 11.6*  HCT 38.2 35.2* 38.9 37.7  MCV 91.8 94.6 94.0 92.0  PLT 244 226 236 229   Cardiac Enzymes: No results for input(s): CKTOTAL, CKMB, CKMBINDEX, TROPONINI in the last 168 hours. BNP: Invalid input(s): POCBNP CBG: No results for input(s): GLUCAP in the last 168 hours. D-Dimer No results for input(s): DDIMER in the last 72  hours. Hgb A1c No results for input(s): HGBA1C in the last 72 hours. Lipid Profile No results for input(s): CHOL, HDL, LDLCALC, TRIG, CHOLHDL, LDLDIRECT in the last 72 hours. Thyroid function studies Recent Labs    10/05/18 0415  TSH 0.024*   Anemia work up No results for input(s): VITAMINB12, FOLATE, FERRITIN, TIBC, IRON, RETICCTPCT in the last 72 hours. Urinalysis    Component Value Date/Time   COLORURINE YELLOW 10/05/2018 0010   APPEARANCEUR CLEAR 10/05/2018 0010   LABSPEC 1.042 (H) 10/05/2018 0010   PHURINE 5.0 10/05/2018 0010   GLUCOSEU NEGATIVE 10/05/2018 0010   HGBUR NEGATIVE 10/05/2018 0010   BILIRUBINUR NEGATIVE 10/05/2018 0010   KETONESUR 20 (A) 10/05/2018 0010   PROTEINUR NEGATIVE 10/05/2018 0010   UROBILINOGEN 1.0 11/17/2012 1147   NITRITE NEGATIVE 10/05/2018 0010   LEUKOCYTESUR SMALL (A) 10/05/2018 0010   Sepsis Labs Invalid input(s): PROCALCITONIN,  WBC,  LACTICIDVEN Microbiology No results found for this or any previous visit (from the past 240 hour(s)).  Time spent: 30 min  SIGNED:   Marylu Lund, MD  [facility] 10/07/2018, 12:30 PM  If 7PM-7AM, please contact night-coverage

## 2018-10-07 NOTE — TOC Progression Note (Addendum)
Transition of Care [facility]) - Progression Note    Patient Details  Name: Tonya Stanley MRN: [id number] Date of Birth: 05/30/1946  [facility] CM/SW Contact  Leeroy Cha, RN Phone Number: 10/07/2018, 12:09 PM  Clinical Narrative:    Patient wanting to go home todsay per dr. Chu/tct= [facility] hosp[facility]) rep.wcb tcf-TRascy with [facility] patient will be contacted today at home.around 5 pm.  Expected Discharge Plan: [place] Barriers to Discharge: No Barriers Identified  Expected Discharge Plan and Services Expected Discharge Plan: [place] arrangements for the past 2 months: Single Family Home Expected Discharge Date: (unknown)                   HH Arranged: RN HH Agency: [facility] Determinants of Health (SDOH) Interventions    Readmission Risk Interventions 30 Day Unplanned Readmission Risk Score     ED to Hosp-Admission (Current) from 10/04/2018 in [place]  30 Day Unplanned Readmission Risk Score (%)  20 Filed at 10/07/2018 1200     This score is the patient's risk of an unplanned readmission within 30 days of being discharged (0 -100%). The score is based on dignosis, age, lab data, medications, orders, and past utilization.   Low:  0-14.9   Medium: 15-21.9   High: 22-29.9   Extreme: 30 and above       No flowsheet data found.

## 2018-10-07 NOTE — Plan of Care (Signed)
Patient lying in bed this morning; no complaints or concerns voiced at this time. Will continue to monitor.

## 2019-01-24 DEATH — deceased

## 2019-08-15 IMAGING — DX DG ABDOMEN 1V
1 series · 1 of 1 positions shown · non-contrast
Comparison: CT abdomen 08/27/2018

CLINICAL DATA: Nasogastric tube placement

EXAM:
ABDOMEN - 1 VIEW

[abdomen kub]
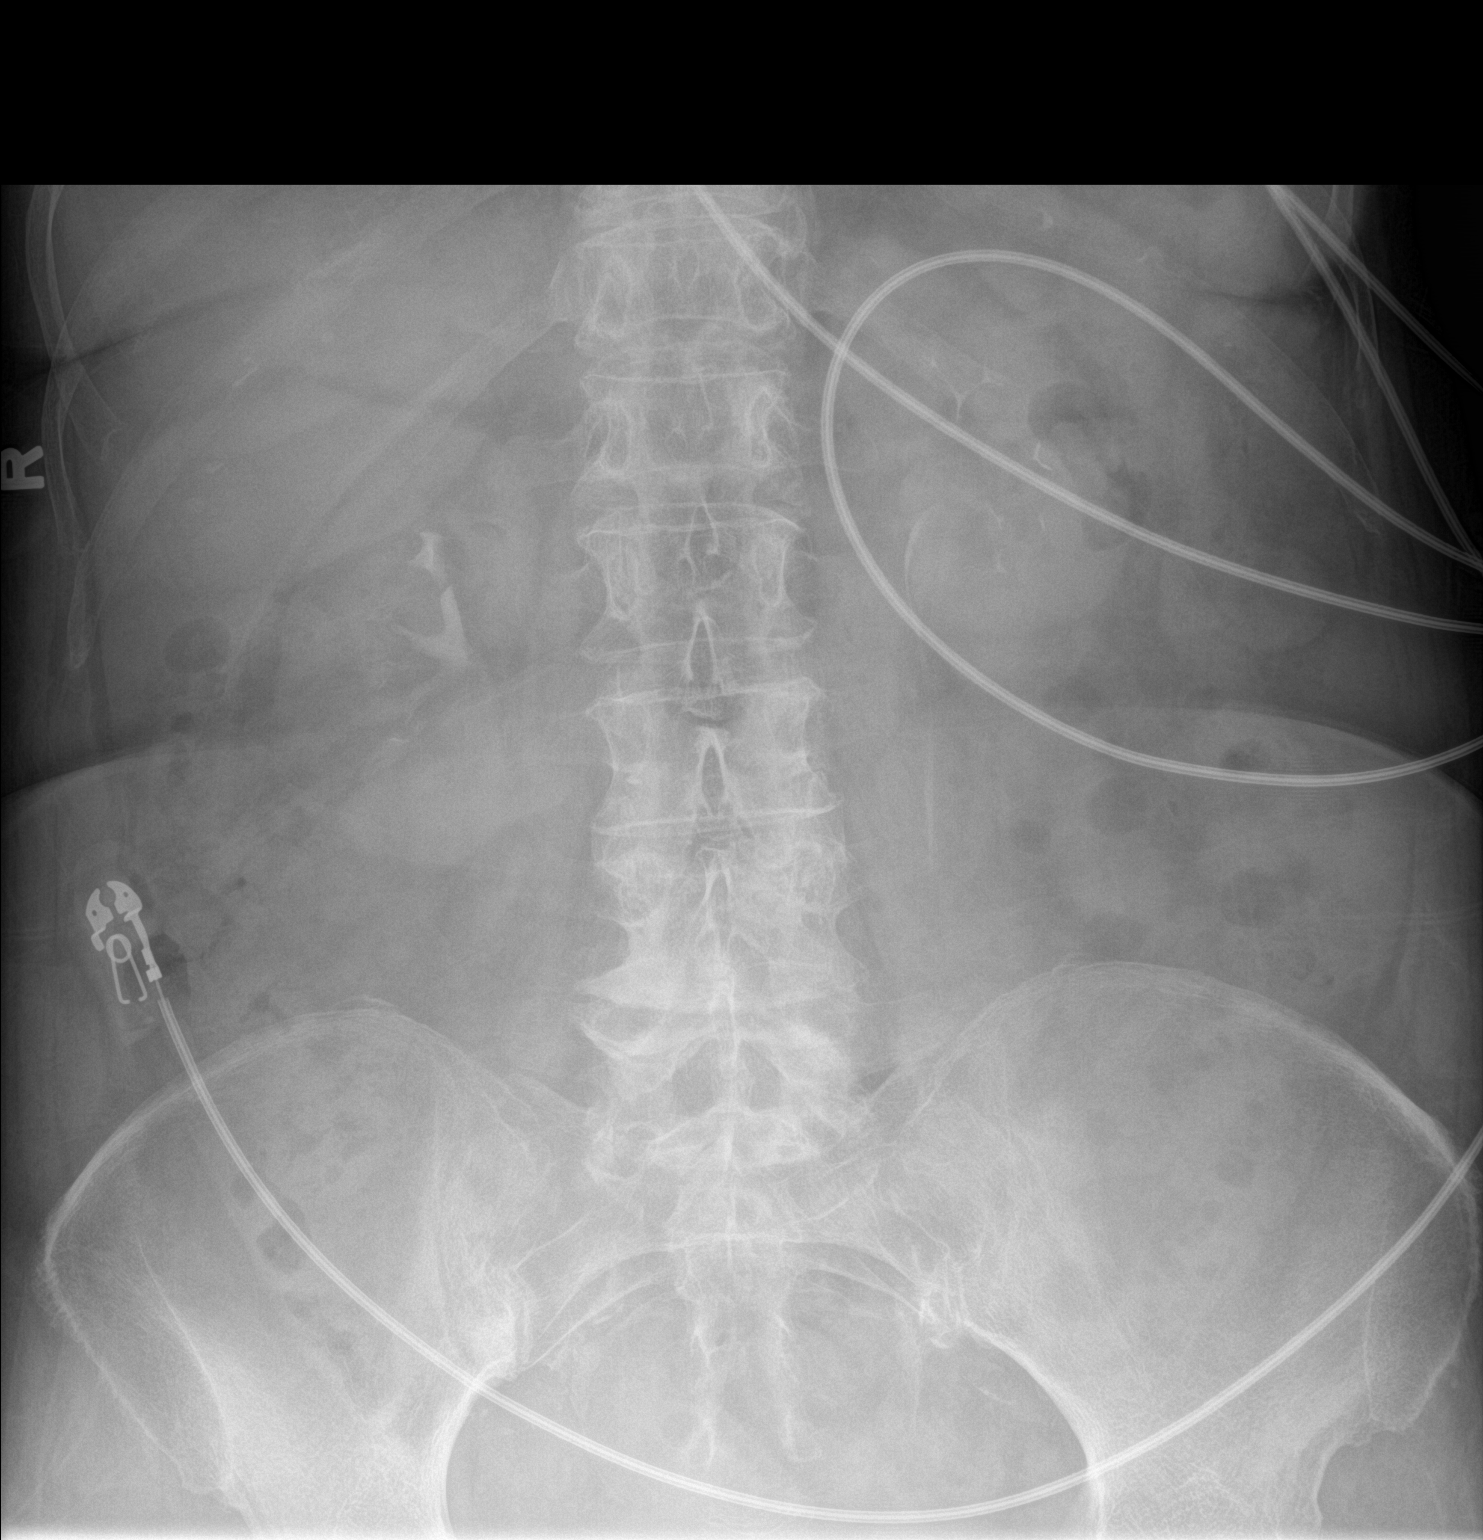

[1 of 1 positions shown; findings below may reference images not displayed]

FINDINGS: Nasogastric tube with the tip projecting over the fundus of the
stomach. There is relative paucity of bowel gas. There is no
evidence of pneumoperitoneum, portal venous gas or pneumatosis.

There are no pathologic calcifications along the expected course of
the ureters. There is excreted contrast in the renal collecting
system bilaterally.

The osseous structures are unremarkable.
IMPRESSION: Nasogastric tube with the tip projecting over the fundus of the
stomach.

## 2019-08-15 IMAGING — DX DG ABD PORTABLE 1V
1 series · 1 of 1 positions shown · non-contrast
Comparison: August 28, 2018 KUB

CLINICAL DATA: 8 hour delay from small-bowel follow-through.
Patient feels much better.

EXAM:
PORTABLE ABDOMEN - 1 VIEW

[abdomen kub]
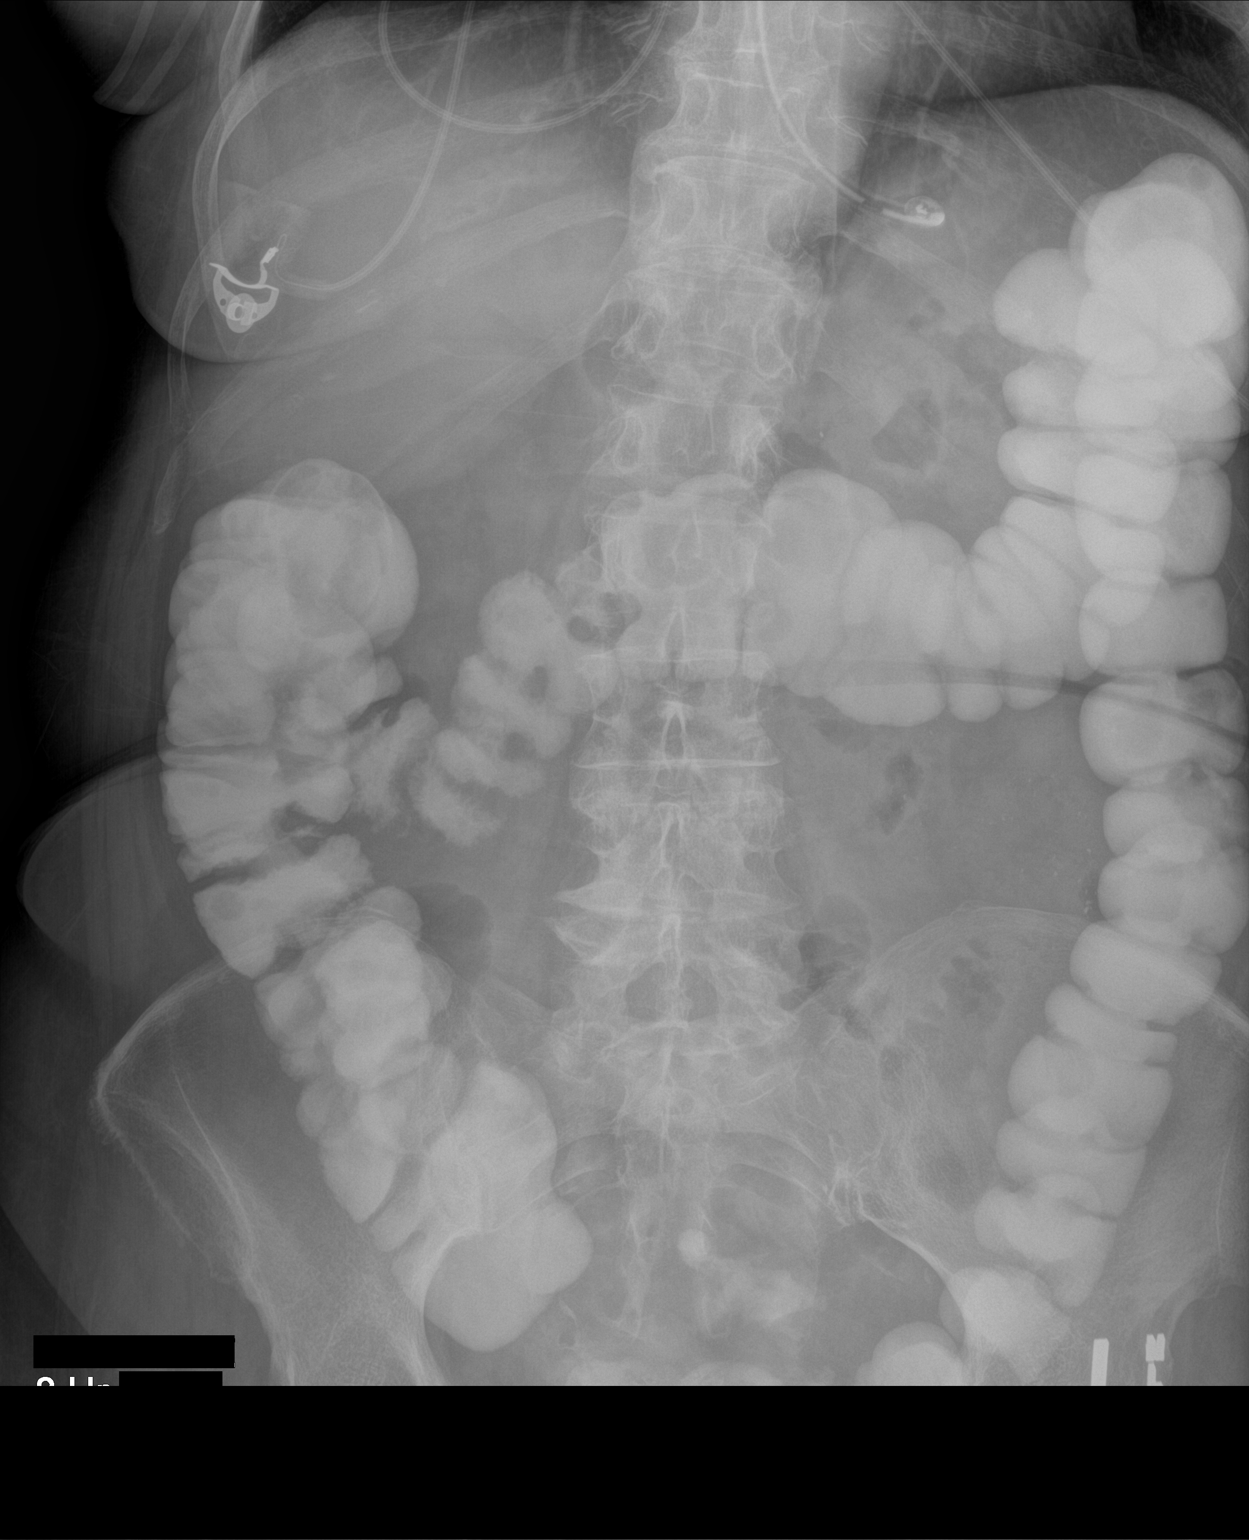

[1 of 1 positions shown; findings below may reference images not displayed]

FINDINGS: There is contrast throughout a normal caliber colon. No small bowel
dilatation noted. No other acute abnormalities.
IMPRESSION: No evidence of small bowel obstruction on this study. Contrast
throughout the colon.

## 2019-09-21 IMAGING — DX PORTABLE ABDOMEN - 1 VIEW
1 series · 1 of 1 positions shown · non-contrast
Comparison: None.

CLINICAL DATA: Encounter for NG tube placement

EXAM:
PORTABLE ABDOMEN - 1 VIEW

[abdomen kub]
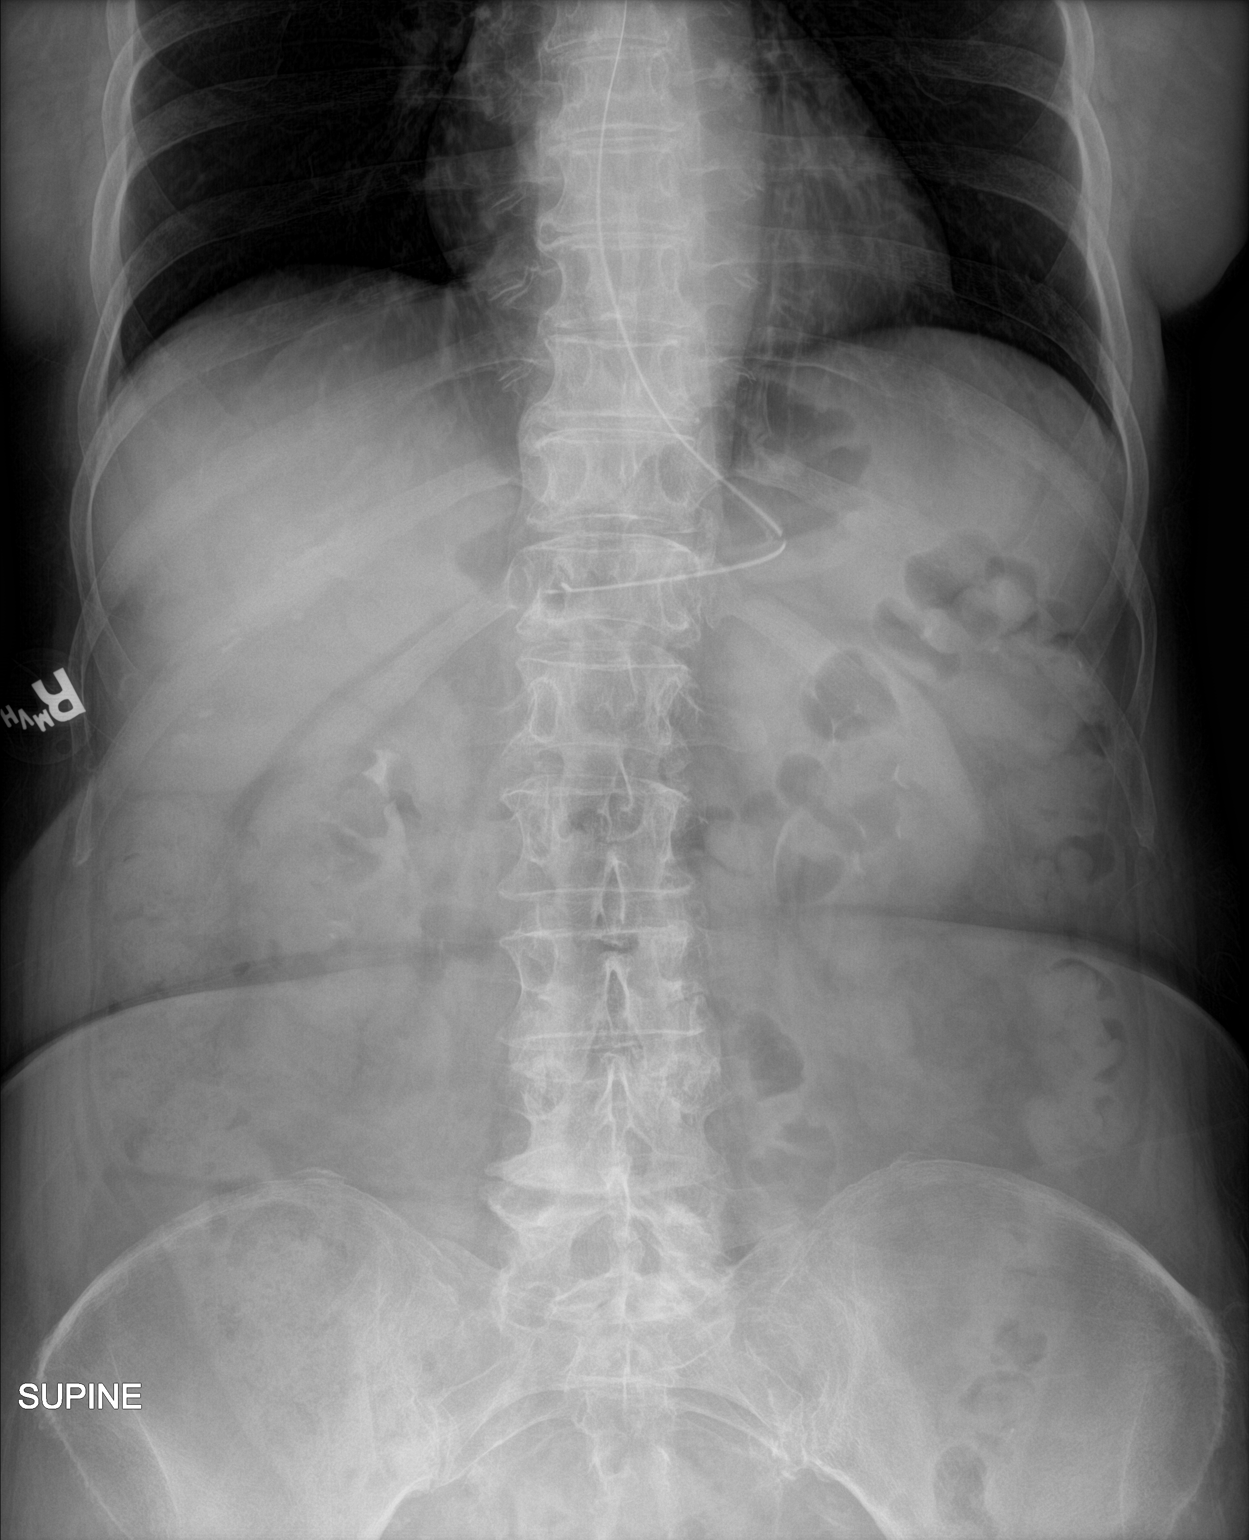

[1 of 1 positions shown; findings below may reference images not displayed]

FINDINGS: The tip and side port of a gastric tube is noted in the expected
location of the gastric body. Contrast is seen both renal collecting
systems without obstruction. Moderate stool retention is seen within
the. No significant small bowel dilatation is identified.
IMPRESSION: Gastric tube is identified in the expected location of the stomach.
No significant bowel distention. Moderate stool retention is noted
within the colon.
